# Patient Record
Sex: Male | Born: 1960 | Race: White | Hispanic: No | Marital: Married | Smoking: Never smoker
Health system: Southern US, Community
[De-identification: ages and names within clinical notes are randomized; demographics above are authoritative.]

## PROBLEM LIST (undated history)

## (undated) DIAGNOSIS — I679 Cerebrovascular disease, unspecified: Secondary | ICD-10-CM

## (undated) DIAGNOSIS — B019 Varicella without complication: Secondary | ICD-10-CM

## (undated) DIAGNOSIS — K219 Gastro-esophageal reflux disease without esophagitis: Secondary | ICD-10-CM

## (undated) DIAGNOSIS — T7840XA Allergy, unspecified, initial encounter: Secondary | ICD-10-CM

## (undated) DIAGNOSIS — C801 Malignant (primary) neoplasm, unspecified: Secondary | ICD-10-CM

## (undated) DIAGNOSIS — I671 Cerebral aneurysm, nonruptured: Secondary | ICD-10-CM

## (undated) DIAGNOSIS — I729 Aneurysm of unspecified site: Secondary | ICD-10-CM

## (undated) DIAGNOSIS — I10 Essential (primary) hypertension: Secondary | ICD-10-CM

## (undated) DIAGNOSIS — E785 Hyperlipidemia, unspecified: Secondary | ICD-10-CM

## (undated) DIAGNOSIS — I639 Cerebral infarction, unspecified: Secondary | ICD-10-CM

## (undated) DIAGNOSIS — R413 Other amnesia: Secondary | ICD-10-CM

## (undated) DIAGNOSIS — B029 Zoster without complications: Secondary | ICD-10-CM

## (undated) HISTORY — PX: TONSILLECTOMY: SUR1361

## (undated) HISTORY — PX: APPENDECTOMY: SHX54

## (undated) HISTORY — PX: CORONARY ANGIOPLASTY: SHX604

## (undated) HISTORY — PX: COLONOSCOPY: SHX174

## (undated) HISTORY — PX: VASECTOMY: SHX75

---

## 2007-06-05 ENCOUNTER — Emergency Department: Payer: Self-pay | Admitting: Emergency Medicine

## 2007-06-05 ENCOUNTER — Other Ambulatory Visit: Payer: Self-pay

## 2007-06-13 ENCOUNTER — Emergency Department: Payer: Self-pay | Admitting: Emergency Medicine

## 2007-06-22 ENCOUNTER — Other Ambulatory Visit: Payer: Self-pay

## 2007-06-22 ENCOUNTER — Emergency Department: Payer: Self-pay | Admitting: Emergency Medicine

## 2007-06-27 ENCOUNTER — Encounter: Admission: RE | Admit: 2007-06-27 | Discharge: 2007-09-25 | Payer: Self-pay | Admitting: Neurology

## 2008-01-05 ENCOUNTER — Ambulatory Visit: Payer: Self-pay | Admitting: Gastroenterology

## 2009-08-26 ENCOUNTER — Emergency Department: Payer: Self-pay | Admitting: Emergency Medicine

## 2011-10-28 ENCOUNTER — Ambulatory Visit: Payer: Self-pay | Admitting: Urology

## 2011-11-04 ENCOUNTER — Ambulatory Visit: Payer: Self-pay | Admitting: Urology

## 2012-02-26 ENCOUNTER — Ambulatory Visit: Payer: Self-pay | Admitting: Gastroenterology

## 2014-05-05 DIAGNOSIS — I6930 Unspecified sequelae of cerebral infarction: Secondary | ICD-10-CM | POA: Insufficient documentation

## 2014-11-18 NOTE — Op Note (Signed)
PATIENT NAME:  Alejandro Arias, Alejandro Arias MR#:  [id number] DATE OF BIRTH:  September 26, 1960  DATE OF PROCEDURE:  11/04/2011  PREOPERATIVE DIAGNOSIS: Undesired fertility.   POSTOPERATIVE DIAGNOSIS: Undesired fertility.   PROCEDURES:  1. Examination under anesthesia.  2. Bilateral partial vasectomy.   SURGEON: Jaydn Moscato C. Bernardo Heater, MD   ASSISTANT: None.   ANESTHETIC: General.   INDICATIONS: This is a 54 year old male with undesired fertility. He was initially evaluated in the office for vasectomy. At his counseling visit a left vas could not be palpated. The procedure was scheduled and on the date of procedure again a left vas could not be palpated. A right vasectomy was performed, however, on postprocedure semen analysis there were multiple sperm present. He desires to proceed with examination under anesthesia and possible left scrotal exploration.   DESCRIPTION OF PROCEDURE: The patient was taken to the operating room where a general anesthetic was administered. On examination under anesthesia, a left vas was palpable. His external genitalia were prepped and draped sterilely. Time-out was performed. A midline 1 cm scrotal incision was made. The left vas was grasped and brought to the incision. [place] was dissected. The vas was grasped with tenaculum. The vas sheath was incised and the vas was delivered. A 1 cm section was excised using a needlepoint Bovie the lumen was cannulated with a Bovie and cauterized both proximally and distally. The distal portion of the vas was buried in a separate fascial layer with a figure-of-eight 3-0 chromic suture.   Since he was under anesthesia, it was elected to repeat his right vasectomy. The vas granuloma was palpated through the midline incision. It was grasped with a tenaculum. The overlying tissue was dissected. The vas distal to the granuloma was dissected free. A 1 cm section was excised. The distal lumen was cannulated with the needlepoint Bovie and cauterized. Hemostasis  was adequate. The incision was closed with an interrupted 3-0 chromic suture. Before repeating the right vasectomy, it was discussed with the patient preoperatively to which he agreed. A dressing of a fluff scrotal support was applied. Incision was infiltrated with 1% plain Xylocaine prior to closure.   He was taken to PAC-U in stable condition. There were no complications. EBL was minimal.    ____________________________ Ronda Fairly. Bernardo Heater, MD scs:drc D: 11/04/2011 13:42:26 ET T: 11/04/2011 15:16:28 ET JOB#: [id number]  cc: Nicki Reaper C. Bernardo Heater, MD, <Dictator> Abbie Sons MD ELECTRONICALLY SIGNED 11/19/2011 18:46

## 2015-04-14 ENCOUNTER — Emergency Department
Admission: EM | Admit: 2015-04-14 | Discharge: 2015-04-14 | Disposition: A | Discharge status: Home / Self Care | Payer: Federal, State, Local not specified - PPO | Attending: Emergency Medicine | Admitting: Emergency Medicine

## 2015-04-14 ENCOUNTER — Emergency Department: Payer: Federal, State, Local not specified - PPO

## 2015-04-14 ENCOUNTER — Encounter: Payer: Self-pay | Admitting: Emergency Medicine

## 2015-04-14 DIAGNOSIS — W1839XA Other fall on same level, initial encounter: Secondary | ICD-10-CM | POA: Diagnosis not present

## 2015-04-14 DIAGNOSIS — S82191A Other fracture of upper end of right tibia, initial encounter for closed fracture: Secondary | ICD-10-CM | POA: Diagnosis not present

## 2015-04-14 DIAGNOSIS — Y9289 Other specified places as the place of occurrence of the external cause: Secondary | ICD-10-CM | POA: Diagnosis not present

## 2015-04-14 DIAGNOSIS — Y998 Other external cause status: Secondary | ICD-10-CM | POA: Insufficient documentation

## 2015-04-14 DIAGNOSIS — Z79899 Other long term (current) drug therapy: Secondary | ICD-10-CM | POA: Insufficient documentation

## 2015-04-14 DIAGNOSIS — S82401A Unspecified fracture of shaft of right fibula, initial encounter for closed fracture: Secondary | ICD-10-CM

## 2015-04-14 DIAGNOSIS — I10 Essential (primary) hypertension: Secondary | ICD-10-CM | POA: Diagnosis not present

## 2015-04-14 DIAGNOSIS — S82141A Displaced bicondylar fracture of right tibia, initial encounter for closed fracture: Secondary | ICD-10-CM

## 2015-04-14 DIAGNOSIS — Y9389 Activity, other specified: Secondary | ICD-10-CM | POA: Insufficient documentation

## 2015-04-14 DIAGNOSIS — S82831A Other fracture of upper and lower end of right fibula, initial encounter for closed fracture: Secondary | ICD-10-CM | POA: Insufficient documentation

## 2015-04-14 DIAGNOSIS — S8991XA Unspecified injury of right lower leg, initial encounter: Secondary | ICD-10-CM | POA: Diagnosis present

## 2015-04-14 HISTORY — DX: Aneurysm of unspecified site: I72.9

## 2015-04-14 HISTORY — DX: Cerebral infarction, unspecified: I63.9

## 2015-04-14 HISTORY — DX: Essential (primary) hypertension: I10

## 2015-04-14 MED ORDER — OXYCODONE-ACETAMINOPHEN 5-325 MG PO TABS: 1.0000 | ORAL_TABLET | Freq: Four times a day (QID) | ORAL | Status: DC | PRN

## 2015-04-14 MED ORDER — KETOROLAC TROMETHAMINE 60 MG/2ML IM SOLN
60.0000 mg | Freq: Once | INTRAMUSCULAR | Status: AC
  Administered 2015-04-14: 60 mg via INTRAMUSCULAR

## 2015-04-14 MED ORDER — KETOROLAC TROMETHAMINE 60 MG/2ML IM SOLN
INTRAMUSCULAR | Status: AC
  Administered 2015-04-14: 60 mg via INTRAMUSCULAR
  Filled 2015-04-14: qty 2

## 2015-04-14 MED ORDER — HYDROMORPHONE HCL 1 MG/ML IJ SOLN
INTRAMUSCULAR | Status: AC
  Administered 2015-04-14: 1 mg via INTRAMUSCULAR
  Filled 2015-04-14: qty 1

## 2015-04-14 MED ORDER — HYDROMORPHONE HCL 1 MG/ML IJ SOLN
1.0000 mg | Freq: Once | INTRAMUSCULAR | Status: AC
  Administered 2015-04-14: 1 mg via INTRAMUSCULAR

## 2015-04-14 NOTE — ED Notes (Signed)
Ice to right knee.

## 2015-04-14 NOTE — ED Notes (Signed)
AaoX3.  SKIN WARM AND DRY.  NAD

## 2015-04-14 NOTE — ED Provider Notes (Signed)
[facility] Emergency Department Provider Note ____________________________________________  Time seen: 2203   I have reviewed the triage vital signs and the nursing notes.  HISTORY  Chief Complaint  Knee Pain  HPI Alejandro Arias is a 54 y.o. male reports to the ED for acute right knee pain and disability after he accidentally fell off of his deck, about 3 feet. He landed on his feet, but his knee immediately buckled. He rates his pain at a 6/10 in triage.   Past Medical History  Diagnosis Date  . Stroke   . Hypertension   . Aneurysm     Brain   There are no active problems to display for this patient.  Past Surgical History  Procedure Laterality Date  . Appendectomy      Current Outpatient Rx  Name  Route  Sig  Dispense  Refill  . losartan-hydrochlorothiazide (HYZAAR) 100-12.5 MG per tablet   Oral   Take 1 tablet by mouth daily.         . pravastatin (PRAVACHOL) 40 MG tablet   Oral   Take 40 mg by mouth daily.         . vitamin B-12 (CYANOCOBALAMIN) 500 MCG tablet   Oral   Take 500 mcg by mouth daily.         Marland Kitchen oxyCODONE-acetaminophen (ROXICET) 5-325 MG per tablet   Oral   Take 1 tablet by mouth every 6 (six) hours as needed for moderate pain or severe pain.   20 tablet   0    Allergies Morphine and related  No family history on file.  Social History Social History  Substance Use Topics  . Smoking status: Never Smoker   . Smokeless tobacco: Never Used  . Alcohol Use: Yes   Review of Systems  Constitutional: Negative for fever. Eyes: Negative for visual changes. ENT: Negative for sore throat. Cardiovascular: Negative for chest pain. Respiratory: Negative for shortness of breath. Gastrointestinal: Negative for abdominal pain, vomiting and diarrhea. Genitourinary: Negative for dysuria. Musculoskeletal: Negative for back pain. Right knee pain Skin: Negative for rash. Neurological: Negative for headaches, focal weakness  or numbness. ____________________________________________  PHYSICAL EXAM:  VITAL SIGNS: ED Triage Vitals  Enc Vitals Group     BP 04/14/15 2133 126/91 mmHg     Pulse Rate 04/14/15 2133 86     Resp 04/14/15 2133 20     Temp --      Temp Source 04/14/15 2133 Oral     SpO2 04/14/15 2133 97 %     Weight 04/14/15 2133 195 lb (88.451 kg)     Height 04/14/15 2133 5\' 11"  (1.803 m)     Head Cir --      Peak Flow --      Pain Score 04/14/15 2130 5     Pain Loc --      Pain Edu? --      Excl. in [place]? --    Constitutional: Alert and oriented. Well appearing and in no distress. Eyes: Conjunctivae are normal. PERRL. Normal extraocular movements. ENT   Head: Normocephalic and atraumatic.   Nose: No congestion/rhinorrhea.   Mouth/Throat: Mucous membranes are moist.   Neck: Supple. No thyromegaly. Hematological/Lymphatic/Immunological: No cervical lymphadenopathy. Cardiovascular: Normal rate, regular rhythm.  Respiratory: Normal respiratory effort. No wheezes/rales/rhonchi. Gastrointestinal: Soft and nontender. No distention. Musculoskeletal: Right knee with lateral depression of fibular head prominence. No effusion noted. Nontender with normal range of motion in all extremities. No calf tenderness.  Neurologic:  Normal gait without ataxia. Normal speech and language. No gross focal neurologic deficits are appreciated. Skin:  Skin is warm, dry and intact. No rash noted. Psychiatric: Mood and affect are normal. Patient exhibits appropriate insight and judgment. ____________________________________________   RADIOLOGY  Right Knee IMPRESSION: Displaced oblique fracture of RIGHT lateral tibial plateau. RIGHT fibular head/neck fracture.  I, Menshew, Dannielle Karvonen, personally viewed and evaluated these images (plain radiographs) as part of my medical decision making.  ____________________________________________  PROCEDURES  Jones Compression Wrap/Knee  Immobilizer Crutches Dilaudid 1 mg IM Toradol 60 mg IM ____________________________________________  INITIAL IMPRESSION / ASSESSMENT AND PLAN / ED COURSE  ----------------------------------------- 11:00 PM on 04/14/2015 ----------------------------------------- Discussed images and management of depressed tibial plateau fracture with Dr. Jerline Pain. He will present to [employer]. Pain management and non-weight bearing until seen by ortho.    Initial fracture care provided for right tibial plateau fracture and knee sprain. Follow-up with Dr. Marry Guan this week. for further fracture care & management.  ____________________________________________  FINAL CLINICAL IMPRESSION(S) / ED DIAGNOSES  Final diagnoses:  Tibial plateau fracture, right, closed, initial encounter  Closed fibular fracture, right, initial encounter     Melvenia Needles, PA-C 04/14/15 2338  Earleen Newport, MD 04/17/15 [phone]

## 2015-04-14 NOTE — ED Notes (Signed)
Stepped off deck and now with right knee pain.

## 2015-04-14 NOTE — Discharge Instructions (Signed)
Cast or Splint Care Casts and splints support injured limbs and keep bones from moving while they heal.  HOME CARE  Keep the cast or splint uncovered during the drying period.  A plaster cast can take 24 to 48 hours to dry.  A fiberglass cast will dry in less than 1 hour.  Do not rest the cast on anything harder than a pillow for 24 hours.  Do not put weight on your injured limb. Do not put pressure on the cast. Wait for your doctor's approval.  Keep the cast or splint dry.  Cover the cast or splint with a plastic bag during baths or wet weather.  If you have a cast over your chest and belly (trunk), take sponge baths until the cast is taken off.  If your cast gets wet, dry it with a towel or blow dryer. Use the cool setting on the blow dryer.  Keep your cast or splint clean. Wash a dirty cast with a damp cloth.  Do not put any objects under your cast or splint.  Do not scratch the skin under the cast with an object. If itching is a problem, use a blow dryer on a cool setting over the itchy area.  Do not trim or cut your cast.  Do not take out the padding from inside your cast.  Exercise your joints near the cast as told by your doctor.  Raise (elevate) your injured limb on 1 or 2 pillows for the first 1 to 3 days. GET HELP IF:  Your cast or splint cracks.  Your cast or splint is too tight or too loose.  You itch badly under the cast.  Your cast gets wet or has a soft spot.  You have a bad smell coming from the cast.  You get an object stuck under the cast.  Your skin around the cast becomes red or sore.  You have new or more pain after the cast is put on. GET HELP RIGHT AWAY IF:  You have fluid leaking through the cast.  You cannot move your fingers or toes.  Your fingers or toes turn blue or white or are cool, painful, or puffy (swollen).  You have tingling or lose feeling (numbness) around the injured area.  You have bad pain or pressure under the  cast.  You have trouble breathing or have shortness of breath.  You have chest pain. Document Released: 11/12/2010 Document Revised: 03/15/2013 Document Reviewed: 01/19/2013 [facility] Patient Information 2015 [place], [place]. This information is not intended to replace advice given to you by your health care provider. Make sure you discuss any questions you have with your health care provider.  Tibial Plateau Fracture, Displaced, Adult You have a fracture (break in bone) of your tibial plateau. This is a fracture in the upper part of the large "shin" bone (tibia) in your lower leg. The plateau is the joint surface that buts up against the femur (thigh bone of your upper leg). This is what makes up your knee joint. Displaced means that a portion of the fracture is out of place from where the bone is supposed to be. Because this fracture goes into the knee joint, it is necessary that this fracture be fixed in the best position possible. This fracture must be fixed surgically. This means an operation must be done to get the bones into the best possible position for healing. Otherwise, this fracture can cause severe arthritis and marked disability over the years. This is likely  to occur even with the best treatment. These fractures are generally diagnosed with x-rays. Often a CT scan is needed to identify the fracture fragments and the degree of displacement. TREATMENT  Your fracture will be reduced (bones fragments are put back into position) and held in place with hardware (fixation devices). When your caregiver feels the fracture is healed well enough, you may begin range of motion exercises to keep your knee limber (moving well). This may be very difficult at first. It is necessary to follow the instructions of all your caregivers, your surgeon, and physical therapist following surgery. LET YOUR CAREGIVER KNOW ABOUT:  Allergies  Medications taken including herbs, eye drops, over the counter  medications, and creams  Use of steroids (by mouth or creams)  History of bleeding or blood problems  Previous problems with anesthetics or novocaine  Possibility of pregnancy, if this applies  History of blood clots (thrombophlebitis)  Previous surgery  Other health problems RISKS AND COMPLICATIONS All surgery is associated with risks. Some of these risks are:  Excessive bleeding.  Infection.  Failure to heal properly resulting in an unstable knee.  Stiffness of knee following repair.  Need to remove the hardware. BEFORE AND AFTER YOUR PROCEDURE Prior to surgery, an IV (intravenous line connected to your vein for giving fluids) may be started. You will also be given an anesthetic. These are medicines and gas to make you sleep. You may also be given regional anesthesia such as a spinal or epidural anesthetic. After surgery, you will be taken to the recovery area where a nurse will monitor your progress. You may have a catheter (a long, narrow, hollow tube) in your bladder following surgery that helps you pass your water. When you are awake, are stable, taking fluids well, and without complications, you will be returned to your room. You will receive physical therapy and other care until you are doing well and your caregiver feels it is safe for you to be transferred either to home or to an extended care facility. HOME CARE INSTRUCTIONS   You may resume normal diet and activities as directed or allowed.  Keep ice packs (a bag of ice wrapped in a towel) on the surgical area for twenty minutes, four times per day, for the first two days following surgery. Use ice only if OK with your surgeon or caregiver.  Change dressings if necessary or as directed.  If you have a plaster or fiberglass cast:  Do not try to scratch the skin under the cast using sharp or pointed objects.  Check the skin around the cast every day. You may put lotion on any red or sore areas.  Keep your cast dry  and clean.  Do not put pressure on any part of your cast or splint until it is fully hardened.  Your cast or splint can be protected during bathing with a plastic bag. Do not lower the cast or splint into water.  Only take over-the-counter or prescription medicines for pain, discomfort, or fever as directed by your caregiver.  Use crutches as directed and do not exercise leg unless instructed.  Keep toes and ankles moving frequently if they are not immobilized in your splint or cast  Keep your leg elevated above your heart as much as possible during the first 24-48 hours after your surgery  These are not fractures to be taken lightly! If these bones become displaced and get out of position, it may eventually lead to arthritis and disability for the  rest of your life. Problems often follow even the best of care. Follow the directions of your caregiver.  Keep appointments as directed. SEEK IMMEDIATE MEDICAL CARE IF:   There is redness, swelling, or increasing pain in the wound.  There is pus coming from wound.  An unexplained oral temperature above 102 F (38.9 C) develops.  You develop a bad smell coming from the wound or dressing.  There is a breaking open of the wound (edges not staying together) after sutures or staples have been removed.  You develop severe pain in the injured leg.  You begin to lose feeling in your foot or toes. Especially if someone else moving your toes becomes increasingly painful  You develop a cold or blue foot or toes on the injured side. If you do not have a window in your cast for observing the wound, a discharge or minor bleeding may show up as a stain on the outside of your cast or caster splint. Report these findings to your caregiver. Document Released: 04/04/2002 Document Revised: 10/05/2011 Document Reviewed: 10/04/2007 [facility] Patient Information 2015 [place], [place]. This information is not intended to replace advice given to you by your  health care provider. Make sure you discuss any questions you have with your health care provider.  Tibial and Fibular Fracture, Adult Tibial and fibular fracture is a break in the bones of your lower leg (tibia and fibula). The tibia is the larger of these two bones. The fibula is the smaller of the two bones. It is on the outer side of your leg.  CAUSES  Low-energy injuries, such as a fall from ground level.  High-energy injuries, such as motor vehicle injuries, gunshot wounds, or high-speed sports collisions. RISK FACTORS  Jumping activities.  Repetitive stress, such as long-distance running.  Participation in sports.  Osteoporosis.  Advanced age. SIGNS AND SYMPTOMS  Pain.  Swelling.  Inability to put weight on your injured leg.  Bone deformities at the site of your injury.  Bruising. DIAGNOSIS  Tibial and fibular fractures are diagnosed with the use of X-ray exams. TREATMENT  If you have a simple fracture of these two bones, they can be treated with simple immobilization. A cast or splint will be used on your leg to keep it from moving while it heals. Then you can begin range-of-motion exercises to regain your knee motion. HOME CARE INSTRUCTIONS   Apply ice to your leg:  Put ice in a plastic bag.  Place a towel between your skin and the bag.  Leave the ice on for 20 minutes, 2-3 times a day.  If you have a plaster or fiberglass cast:  Do not try to scratch the skin under the cast using sharp or pointed objects.  Check the skin around the cast every day. You may put lotion on any red or sore areas.  Keep your cast dry and clean.  If you have a plaster splint:  Wear the splint as directed.  You may loosen the elastic around the splint if your toes become numb, tingle, or turn cold or blue.  Do not put pressure on any part of your cast or splint until it is fully hardened, because it may deform.  Your cast or splint can be protected during bathing with a  plastic bag. Do not lower the cast or splint into water.  Use crutches as directed.  Only take over-the-counter or prescription medicines for pain, discomfort, or fever as directed by your health care provider.  Follow  all instructions given to you by your health care provider.  Make and keep all follow-up appointments. SEEK MEDICAL CARE IF:  Your pain is becoming worse rather than better or is not controlled with medicines.  You have increased swelling or redness in the foot.  You begin to lose feeling in your foot or toes. SEEK IMMEDIATE MEDICAL CARE IF:  You develop a cold or blue foot or toes on the injured side.  You develop severe pain in your injured leg, especially if the pain is increased with movement of your toes. MAKE SURE YOU:  Understand these instructions.  Will watch your condition.  Will get help right away if you are not doing well or get worse. Document Released: 04/04/2002 Document Revised: 05/03/2013 Document Reviewed: 02/22/2013 [facility] Patient Information 2015 [place], [place]. This information is not intended to replace advice given to you by your health care provider. Make sure you discuss any questions you have with your health care provider.  Wear the splint & immobilizer AT ALL TIMES! You may use the crutches for walking Without ANY Weight on your right leg. Take the pain medicine as needed. Apply ice through the splint for pain and swelling. Expect a call from Dr. Clydell Hakim office to schedule follow-up for the next week.

## 2015-08-07 ENCOUNTER — Emergency Department
Admission: EM | Admit: 2015-08-07 | Discharge: 2015-08-08 | Disposition: A | Discharge status: Home / Self Care | Payer: Federal, State, Local not specified - PPO | Attending: Emergency Medicine | Admitting: Emergency Medicine

## 2015-08-07 ENCOUNTER — Encounter: Payer: Self-pay | Admitting: Urgent Care

## 2015-08-07 DIAGNOSIS — M542 Cervicalgia: Secondary | ICD-10-CM | POA: Diagnosis not present

## 2015-08-07 DIAGNOSIS — I10 Essential (primary) hypertension: Secondary | ICD-10-CM | POA: Diagnosis not present

## 2015-08-07 DIAGNOSIS — R51 Headache: Secondary | ICD-10-CM | POA: Diagnosis not present

## 2015-08-07 DIAGNOSIS — R519 Headache, unspecified: Secondary | ICD-10-CM

## 2015-08-07 DIAGNOSIS — Z79899 Other long term (current) drug therapy: Secondary | ICD-10-CM | POA: Insufficient documentation

## 2015-08-07 HISTORY — DX: Hyperlipidemia, unspecified: E78.5

## 2015-08-07 NOTE — ED Notes (Signed)
Pt states he has neck pain that began about an hour ago, states it is mostly in front, on both sides of neck, not radiating anywhere. Pt is able to turn head from side to side. Pt also states he has HTN, has hx of HTN, wife took BP at home. Pt took baby aspirin and 1 Amlodipine before arrival.

## 2015-08-07 NOTE — ED Notes (Signed)
Patient presents with c/o pain in his neck. Patient reports that he was sitting at home and began feeling "really hot" and BP was elevated to the 170s/110s. PMH significant for CVA x 6, HTN, and cerebral aneurysm

## 2015-08-08 ENCOUNTER — Emergency Department: Payer: Federal, State, Local not specified - PPO

## 2015-08-08 DIAGNOSIS — M542 Cervicalgia: Secondary | ICD-10-CM | POA: Diagnosis not present

## 2015-08-08 LAB — COMPREHENSIVE METABOLIC PANEL
ALT: 18 U/L (ref 17–63)
AST: 15 U/L (ref 15–41)
Albumin: 4.1 g/dL (ref 3.5–5.0)
Alkaline Phosphatase: 71 U/L (ref 38–126)
Anion gap: 7 (ref 5–15)
BUN: 20 mg/dL (ref 6–20)
CHLORIDE: 105 mmol/L (ref 101–111)
CO2: 26 mmol/L (ref 22–32)
Calcium: 8.9 mg/dL (ref 8.9–10.3)
Creatinine, Ser: 1.19 mg/dL (ref 0.61–1.24)
GFR calc Af Amer: >60 mL/min (ref >60)
Glucose, Bld: 125 mg/dL — ABNORMAL HIGH (ref 65–99)
POTASSIUM: 3.3 mmol/L — AB (ref 3.5–5.1)
SODIUM: 138 mmol/L (ref 135–145)
Total Bilirubin: 0.8 mg/dL (ref 0.3–1.2)
Total Protein: 7.3 g/dL (ref 6.5–8.1)

## 2015-08-08 LAB — CBC
HCT: 45.8 % (ref 40.0–52.0)
Hemoglobin: 15.4 g/dL (ref 13.0–18.0)
MCH: 27.5 pg (ref 26.0–34.0)
MCHC: 33.6 g/dL (ref 32.0–36.0)
MCV: 81.8 fL (ref 80.0–100.0)
PLATELETS: 272 10*3/uL (ref 150–440)
RBC: 5.6 MIL/uL (ref 4.40–5.90)
RDW: 13.8 % (ref 11.5–14.5)
WBC: 8.4 10*3/uL (ref 3.8–10.6)

## 2015-08-08 LAB — TROPONIN I: Troponin I: <0.03 ng/mL (ref <0.031)

## 2015-08-08 LAB — PROTIME-INR
INR: 0.97
PROTHROMBIN TIME: 13.1 s (ref 11.4–15.0)

## 2015-08-08 MED ORDER — GADOBENATE DIMEGLUMINE 529 MG/ML IV SOLN
20.0000 mL | Freq: Once | INTRAVENOUS | Status: AC | PRN
  Administered 2015-08-08: 18 mL via INTRAVENOUS

## 2015-08-08 NOTE — ED Notes (Signed)
Pt states he had 6 strokes in 2 weeks, states he was left with peripheral vision loss, sees sepia tone, short term memory loss. Pt states 2 strokes were hemorrhagic. Pt has small brain aneurism from [place]. This was all 8 years ago.

## 2015-08-08 NOTE — ED Notes (Signed)
MRI tech, Jinny Blossom, called by Dr. Owens Shark. Jinny Blossom came and MRI check list completed. [place], [place] or [place], ED tech will come sit with MRI tech and pt while scan is being done.

## 2015-08-08 NOTE — ED Provider Notes (Addendum)
[facility] Emergency Department Provider Note  ____________________________________________  Time seen: 11:55 PM  I have reviewed the triage vital signs and the nursing notes.   HISTORY  Chief Complaint Neck Pain and Hypertension      HPI Alejandro Arias is a 55 y.o. male presents with acute onset of bilateral anterior neck pain and elevated blood pressure 170/110 this evening. 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 Patient denies any weakness no numbness or gait instability. Patient denies any new change in vision.     Past Medical History  Diagnosis Date  . Stroke ([place])   . Hypertension   . Aneurysm ([place])     Brain  . Hyperlipemia     There are no active problems to display for this patient.   Past Surgical History  Procedure Laterality Date  . Appendectomy      Current Outpatient Rx  Name  Route  Sig  Dispense  Refill  . losartan-hydrochlorothiazide (HYZAAR) 100-12.5 MG per tablet   Oral   Take 1 tablet by mouth daily.         Marland Kitchen oxyCODONE-acetaminophen (ROXICET) 5-325 MG per tablet   Oral   Take 1 tablet by mouth every 6 (six) hours as needed for moderate pain or severe pain.   20 tablet   0   . pravastatin (PRAVACHOL) 40 MG tablet   Oral   Take 40 mg by mouth daily.         . vitamin B-12 (CYANOCOBALAMIN) 500 MCG tablet   Oral   Take 500 mcg by mouth daily.           Allergies Morphine and related  No family history on file.  Social History Social History  Substance Use Topics  . Smoking status: Never Smoker   . Smokeless tobacco: Never Used  . Alcohol Use: Yes    Review of Systems  Constitutional: Negative for fever. Eyes: Negative for visual changes. ENT: Negative for sore throat.Positive for neck pain   Cardiovascular: Negative for chest pain. Respiratory: Negative for shortness of breath. Gastrointestinal: Negative for abdominal pain, vomiting and diarrhea. Genitourinary: Negative for dysuria. Musculoskeletal: Negative for back pain. Skin: Negative for rash. Neurological: Negative for headaches, focal weakness or numbness.   10-point ROS otherwise negative.  ____________________________________________   PHYSICAL EXAM:  VITAL SIGNS: ED Triage Vitals  Enc Vitals Group     BP 08/07/15 2340 156/101 mmHg     Pulse Rate 08/07/15 2340 78     Resp 08/07/15 2340 18     Temp --      Temp src --      SpO2 08/07/15 2340 98 %     Weight 08/07/15 2340 195 lb (88.451 kg)     Height 08/07/15 2340 5\' 11"  (1.803 m)     Head Cir --      Peak Flow --      Pain Score 08/07/15 2341 0     Pain Loc --      Pain Edu? --      Excl. in [place]? --      Constitutional: Alert and oriented. Well appearing and in no distress. Eyes: Conjunctivae are normal. PERRL. Normal extraocular movements. ENT   Head: Normocephalic and atraumatic.   Nose: No  congestion/rhinnorhea.   Mouth/Throat: Mucous membranes are moist.   Neck: No stridor. Hematological/Lymphatic/Immunilogical: No cervical lymphadenopathy. Cardiovascular: Normal rate, regular rhythm. Normal and symmetric distal pulses are present in all extremities. No murmurs, rubs, or gallops. Respiratory: Normal respiratory effort without tachypnea nor retractions. Breath sounds are clear and equal bilaterally. No wheezes/rales/rhonchi. Gastrointestinal: Soft and nontender. No distention. There is no CVA tenderness. Genitourinary: deferred Musculoskeletal: Nontender with normal range of motion in all extremities. No joint effusions.  No lower extremity tenderness nor edema. Neurologic:  Normal speech and language. No gross focal neurologic deficits are appreciated. Speech is normal.  Skin:  Skin is warm, dry and intact. No rash  noted. Psychiatric: Mood and affect are normal. Speech and behavior are normal. Patient exhibits appropriate insight and judgment.  ____________________________________________    LABS (pertinent positives/negatives)  Labs Reviewed  COMPREHENSIVE METABOLIC PANEL - Abnormal; Notable for the following:    Potassium 3.3 (*)    Glucose, Bld 125 (*)    All other components within normal limits  CBC  PROTIME-INR  TROPONIN I   ED ECG REPORT I, Joshaua Epple, Cherry N, the attending physician, personally viewed and interpreted this ECG.   Date: 08/08/2015  EKG Time: 11:42 PM  Rate: 76  Rhythm: Normal sinus rhythm  Axis: None  Intervals: Normal  ST&T Change: None    RADIOLOGY      MR Brain W Wo Contrast (Final result) Result time: 08/08/15 05:05:04   Final result by Rad Results In Interface (08/08/15 05:05:04)   Narrative:   CLINICAL DATA: Initial evaluation for acute headache, neck pain.  EXAM: MRI HEAD WITHOUT AND WITH CONTRAST  MRA HEAD WITHOUT CONTRAST  MRA NECK WITHOUT AND WITH CONTRAST  TECHNIQUE: Multiplanar, multiecho pulse sequences of the brain and surrounding structures were obtained without and with intravenous contrast. Angiographic images of the Circle of Willis were obtained using MRA technique without intravenous contrast. Angiographic images of the neck were obtained using MRA technique without and with intravenous contrast. Carotid stenosis measurements (when applicable) are obtained utilizing NASCET criteria, using the distal internal carotid diameter as the denominator.  CONTRAST: 20mL MULTIHANCE GADOBENATE DIMEGLUMINE 529 MG/ML IV SOLN  COMPARISON: Prior CT from 08/26/2009.  FINDINGS: MRI HEAD FINDINGS  Cerebral volume within normal limits for patient age. No significant white matter disease. Extensive encephalomalacia involving the bilateral occipital lobes with associated gliosis, consistent with remote ischemic infarcts. Associated  chronic blood products present with these infarcts.  Heterogeneous T2 popcorn like lesion within the periventricular white matter adjacent to the posterior left lateral ventricle measures 17 mm. Imaging characteristics most consistent with a cavernoma. This lesion demonstrates intrinsic T1 signal intensity with extensive blooming artifact on gradient echo sequence, compatible with blood products. No significant edema about this lesion to suggest acute hemorrhage. Moreover, this lesion is similar in size relative to prior CT from 2011. Small 4 mm cavernoma within the deep white matter of the anterior right centrum semi ovale. 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  No evidence for acute infarct. Major intracranial vascular flow voids are maintained. Remaining gray-white matter differentiation well preserved.  No other mass lesion. No mass effect or midline shift. No hydrocephalus. Ex vacuo dilatation of the occipital horns of both lateral ventricles present related to the remote occipital infarcts. No extra-axial fluid collection. No abnormal enhancement.  Craniocervical junction normal.  Pituitary gland normal.  No acute abnormality about the orbits.  Right maxillary sinus is completely opacified. Scattered polypoid opacity within the left maxillary sinus. Scattered opacity with mucosal thickening within the ethmoidal air cells, sphenoid sinuses, and left frontal sinus. Trace opacity within the left mastoid air cells. Inner ear structures grossly normal.  Bone marrow signal intensity within normal limits. No scalp soft tissue abnormality.  MRA HEAD FINDINGS  ANTERIOR CIRCULATION:  Study degraded by motion artifact.  Visualized distal cervical segments of the internal  carotid arteries are widely patent with antegrade flow. Petrous, cavernous, and supraclinoid segments are well opacified. A1 segments patent. The left A1 segment is slightly hypoplastic. Anterior communicating artery not well evaluated on this exam due to motion artifact. Despite this, there is question of a small 4-5 mm aneurysm arising from the anterior communicating artery, better evaluated on MRA neck portion of this exam (series 9, image 86). Anterior cerebral arteries well opacified. M1 segments patent without stenosis or occlusion. Attenuation of the distal M1 segments consistent with artifact/motion. MCA bifurcations within normal limits. Distal MCA branches opacified and symmetric.  POSTERIOR CIRCULATION:  Vertebral arteries patent to the vertebrobasilar junction. Right vertebral artery is dominant. Distal vertebral arteries mildly with mild multi focal narrowing without focal high-grade stenosis. Posterior inferior cerebral arteries not well evaluated on this exam. Basilar artery widely patent. Superior cerebellar and posterior cerebral arteries opacified bilaterally.  No convincing evidence for aneurysm on this exam.  MRA NECK FINDINGS  Partially visualized aortic arch is of normal caliber with normal 3 vessel morphology. No high-grade stenosis seen at the origin of the great vessels. Partially visualized subclavian arteries patent without acute abnormality.  Right common carotid artery patent from its origin to the bifurcation. No significant atheromatous disease about the right carotid bifurcation. Right ICA patent from the bifurcation to the skull base without flow-limiting stenosis or occlusion. No definite evidence for dissection, although evaluation somewhat limited on this exam.  Left common carotid artery patent from its origin to the bifurcation. Left ICA widely patent from the bifurcation to the skullbase. No evidence for stenosis, occlusion, or definite  evidence for dissection, although evaluation limited on this exam.  Vertebral arteries both arise from the subclavian arteries. Right vertebral artery dominant. Vertebral arteries widely patent to the skullbase without focal stenosis, occlusion, or definite evidence for dissection.  IMPRESSION: MRI HEAD IMPRESSION:  1. No acute intracranial process. 2. Remote hemorrhagic bilateral occipital infarcts with associated gliosis and encephalomalacia. 3. 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 Dominant lesion within the left periventricular white matter, similar to prior CT from 2011.  MRA HEAD IMPRESSION:  1. No proximal or large arterial branch occlusion. No high-grade or correctable stenosis. 2. Atheromatous irregularity with mild multi focal narrowing within the distal vertebral arteries bilaterally. 3. 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 This is better evaluated on the MRA neck portion of this exam.  MRA NECK IMPRESSION:  Normal MRA of the neck.   Electronically Signed By: Jeannine Boga M.D. On: 08/08/2015 05:04          MR Angiogram Neck W Wo Contrast (Final result) Result time: 08/08/15 05:05:04   Final result by Rad Results In Interface (08/08/15 05:05:04)   Narrative:   CLINICAL DATA: Initial evaluation for acute headache, neck pain.  EXAM: MRI HEAD WITHOUT AND WITH CONTRAST  MRA HEAD WITHOUT CONTRAST  MRA NECK WITHOUT AND WITH CONTRAST  TECHNIQUE: Multiplanar, multiecho pulse sequences of the brain and surrounding structures were obtained without  and with intravenous contrast. Angiographic images of the Circle of Willis were obtained using MRA technique without intravenous contrast. Angiographic images of the neck were obtained using MRA technique without and with intravenous contrast. Carotid stenosis measurements (when applicable) are obtained utilizing NASCET criteria, using  the distal internal carotid diameter as the denominator.  CONTRAST: 80mL MULTIHANCE GADOBENATE DIMEGLUMINE 529 MG/ML IV SOLN  COMPARISON: Prior CT from 08/26/2009.  FINDINGS: MRI HEAD FINDINGS  Cerebral volume within normal limits for patient age. No significant white matter disease. Extensive encephalomalacia involving the bilateral occipital lobes with associated gliosis, consistent with remote ischemic infarcts. Associated chronic blood products present with these infarcts.  Heterogeneous T2 popcorn like lesion within the periventricular white matter adjacent to the posterior left lateral ventricle measures 17 mm. Imaging characteristics most consistent with a cavernoma. This lesion demonstrates intrinsic T1 signal intensity with extensive blooming artifact on gradient echo sequence, compatible with blood products. No significant edema about this lesion to suggest acute hemorrhage. Moreover, this lesion is similar in size relative to prior CT from 2011. Small 4 mm cavernoma within the deep white matter of the anterior right centrum semi ovale. 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  No evidence for acute infarct. Major intracranial vascular flow voids are maintained. Remaining gray-white matter differentiation well preserved.  No other mass lesion. No mass effect or midline shift. No hydrocephalus. Ex vacuo dilatation of the occipital horns of both lateral ventricles present related to the remote occipital infarcts. No extra-axial fluid collection. No abnormal enhancement.  Craniocervical junction normal.  Pituitary gland normal. No acute abnormality about the orbits.  Right maxillary sinus is completely opacified. Scattered polypoid opacity within the left  maxillary sinus. Scattered opacity with mucosal thickening within the ethmoidal air cells, sphenoid sinuses, and left frontal sinus. Trace opacity within the left mastoid air cells. Inner ear structures grossly normal.  Bone marrow signal intensity within normal limits. No scalp soft tissue abnormality.  MRA HEAD FINDINGS  ANTERIOR CIRCULATION:  Study degraded by motion artifact.  Visualized distal cervical segments of the internal carotid arteries are widely patent with antegrade flow. Petrous, cavernous, and supraclinoid segments are well opacified. A1 segments patent. The left A1 segment is slightly hypoplastic. Anterior communicating artery not well evaluated on this exam due to motion artifact. Despite this, there is question of a small 4-5 mm aneurysm arising from the anterior communicating artery, better evaluated on MRA neck portion of this exam (series 9, image 86). Anterior cerebral arteries well opacified. M1 segments patent without stenosis or occlusion. Attenuation of the distal M1 segments consistent with artifact/motion. MCA bifurcations within normal limits. Distal MCA branches opacified and symmetric.  POSTERIOR CIRCULATION:  Vertebral arteries patent to the vertebrobasilar junction. Right vertebral artery is dominant. Distal vertebral arteries mildly with mild multi focal narrowing without focal high-grade stenosis. Posterior inferior cerebral arteries not well evaluated on this exam. Basilar artery widely patent. Superior cerebellar and posterior cerebral arteries opacified bilaterally.  No convincing evidence for aneurysm on this exam.  MRA NECK FINDINGS  Partially visualized aortic arch is of normal caliber with normal 3 vessel morphology. No high-grade stenosis seen at the origin of the great vessels. Partially visualized subclavian arteries patent without acute abnormality.  Right common carotid artery patent from its origin to the bifurcation. No  significant atheromatous disease about the right carotid bifurcation. Right ICA patent  from the bifurcation to the skull base without flow-limiting stenosis or occlusion. No definite evidence for dissection, although evaluation somewhat limited on this exam.  Left common carotid artery patent from its origin to the bifurcation. Left ICA widely patent from the bifurcation to the skullbase. No evidence for stenosis, occlusion, or definite evidence for dissection, although evaluation limited on this exam.  Vertebral arteries both arise from the subclavian arteries. Right vertebral artery dominant. Vertebral arteries widely patent to the skullbase without focal stenosis, occlusion, or definite evidence for dissection.  IMPRESSION: MRI HEAD IMPRESSION:  1. No acute intracranial process. 2. Remote hemorrhagic bilateral occipital infarcts with associated gliosis and encephalomalacia. 3. 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 Dominant lesion within the left periventricular white matter, similar to prior CT from 2011.  MRA HEAD IMPRESSION:  1. No proximal or large arterial branch occlusion. No high-grade or correctable stenosis. 2. Atheromatous irregularity with mild multi focal narrowing within the distal vertebral arteries bilaterally. 3. 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 This is better evaluated on the MRA neck portion of this exam.  MRA NECK IMPRESSION:  Normal MRA of the neck.   Electronically Signed By: Jeannine Boga M.D. On: 08/08/2015 05:04          MR MRA HEAD WO CONTRAST (Final result) Result time: 08/08/15 05:05:04   Final result by Rad Results In Interface (08/08/15 05:05:04)   Narrative:   CLINICAL DATA: Initial evaluation for acute headache, neck pain.  EXAM: MRI HEAD WITHOUT AND WITH CONTRAST  MRA HEAD WITHOUT CONTRAST  MRA NECK WITHOUT AND WITH  CONTRAST  TECHNIQUE: Multiplanar, multiecho pulse sequences of the brain and surrounding structures were obtained without and with intravenous contrast. Angiographic images of the Circle of Willis were obtained using MRA technique without intravenous contrast. Angiographic images of the neck were obtained using MRA technique without and with intravenous contrast. Carotid stenosis measurements (when applicable) are obtained utilizing NASCET criteria, using the distal internal carotid diameter as the denominator.  CONTRAST: 26mL MULTIHANCE GADOBENATE DIMEGLUMINE 529 MG/ML IV SOLN  COMPARISON: Prior CT from 08/26/2009.  FINDINGS: MRI HEAD FINDINGS  Cerebral volume within normal limits for patient age. No significant white matter disease. Extensive encephalomalacia involving the bilateral occipital lobes with associated gliosis, consistent with remote ischemic infarcts. Associated chronic blood products present with these infarcts.  Heterogeneous T2 popcorn like lesion within the periventricular white matter adjacent to the posterior left lateral ventricle measures 17 mm. Imaging characteristics most consistent with a cavernoma. This lesion demonstrates intrinsic T1 signal intensity with extensive blooming artifact on gradient echo sequence, compatible with blood products. No significant edema about this lesion to suggest acute hemorrhage. Moreover, this lesion is similar in size relative to prior CT from 2011. Small 4 mm cavernoma within the deep white matter of the anterior right centrum semi ovale. 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  No evidence for acute infarct. Major intracranial vascular flow voids are maintained. Remaining gray-white matter differentiation well  preserved.  No other mass lesion. No mass effect or midline shift. No hydrocephalus. Ex vacuo dilatation of the occipital horns of both lateral ventricles present related to the remote occipital infarcts. No extra-axial fluid collection. No abnormal enhancement.  Craniocervical junction normal.  Pituitary gland normal. No acute abnormality about the orbits.  Right maxillary sinus is completely opacified. Scattered polypoid opacity  within the left maxillary sinus. Scattered opacity with mucosal thickening within the ethmoidal air cells, sphenoid sinuses, and left frontal sinus. Trace opacity within the left mastoid air cells. Inner ear structures grossly normal.  Bone marrow signal intensity within normal limits. No scalp soft tissue abnormality.  MRA HEAD FINDINGS  ANTERIOR CIRCULATION:  Study degraded by motion artifact.  Visualized distal cervical segments of the internal carotid arteries are widely patent with antegrade flow. Petrous, cavernous, and supraclinoid segments are well opacified. A1 segments patent. The left A1 segment is slightly hypoplastic. Anterior communicating artery not well evaluated on this exam due to motion artifact. Despite this, there is question of a small 4-5 mm aneurysm arising from the anterior communicating artery, better evaluated on MRA neck portion of this exam (series 9, image 86). Anterior cerebral arteries well opacified. M1 segments patent without stenosis or occlusion. Attenuation of the distal M1 segments consistent with artifact/motion. MCA bifurcations within normal limits. Distal MCA branches opacified and symmetric.  POSTERIOR CIRCULATION:  Vertebral arteries patent to the vertebrobasilar junction. Right vertebral artery is dominant. Distal vertebral arteries mildly with mild multi focal narrowing without focal high-grade stenosis. Posterior inferior cerebral arteries not well evaluated on this exam. Basilar artery widely  patent. Superior cerebellar and posterior cerebral arteries opacified bilaterally.  No convincing evidence for aneurysm on this exam.  MRA NECK FINDINGS  Partially visualized aortic arch is of normal caliber with normal 3 vessel morphology. No high-grade stenosis seen at the origin of the great vessels. Partially visualized subclavian arteries patent without acute abnormality.  Right common carotid artery patent from its origin to the bifurcation. No significant atheromatous disease about the right carotid bifurcation. Right ICA patent from the bifurcation to the skull base without flow-limiting stenosis or occlusion. No definite evidence for dissection, although evaluation somewhat limited on this exam.  Left common carotid artery patent from its origin to the bifurcation. Left ICA widely patent from the bifurcation to the skullbase. No evidence for stenosis, occlusion, or definite evidence for dissection, although evaluation limited on this exam.  Vertebral arteries both arise from the subclavian arteries. Right vertebral artery dominant. Vertebral arteries widely patent to the skullbase without focal stenosis, occlusion, or definite evidence for dissection.  IMPRESSION: MRI HEAD IMPRESSION:  1. No acute intracranial process. 2. Remote hemorrhagic bilateral occipital infarcts with associated gliosis and encephalomalacia. 3. 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 Dominant lesion within the left periventricular white matter, similar to prior CT from 2011.  MRA HEAD IMPRESSION:  1. No proximal or large arterial branch occlusion. No high-grade or correctable stenosis. 2. Atheromatous irregularity with mild multi focal narrowing within the distal vertebral arteries bilaterally. 3. 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 This is better evaluated on the MRA neck portion of this exam.  MRA NECK IMPRESSION:  Normal  MRA of the neck.   Electronically Signed By: Jeannine Boga M.D      [place] / [place] / ED COURSE  Pertinent labs & imaging results that were available during my care of the patient were reviewed by me and considered in my medical decision making (see chart for details).  No clear etiology noted for the patient's anterior neck pain. MRA of the neck read as normal. No acute findings noted on the patient's MRA of the brain. Patient will be referred to Dr. Myrtie Hawk primary care provider for further evaluation on the outpatient setting. Of note patient has no pain at present  ____________________________________________  FINAL CLINICAL IMPRESSION(S) / ED DIAGNOSES  Final diagnoses:  Headache  Neck pain      Gregor Hams, MD 08/08/15 0530  Gregor Hams, MD 08/08/15 [phone]

## 2016-07-16 ENCOUNTER — Encounter
Admission: RE | Disposition: A | Discharge status: Home / Self Care | Payer: Self-pay | Source: Ambulatory Visit | Attending: Internal Medicine

## 2016-07-16 ENCOUNTER — Observation Stay
Admission: RE | Admit: 2016-07-16 | Discharge: 2016-07-17 | Disposition: A | Discharge status: Home / Self Care | Payer: Federal, State, Local not specified - PPO | Source: Ambulatory Visit | Attending: Internal Medicine | Admitting: Internal Medicine

## 2016-07-16 ENCOUNTER — Encounter: Payer: Self-pay | Admitting: *Deleted

## 2016-07-16 DIAGNOSIS — Z955 Presence of coronary angioplasty implant and graft: Secondary | ICD-10-CM | POA: Diagnosis not present

## 2016-07-16 DIAGNOSIS — J45909 Unspecified asthma, uncomplicated: Secondary | ICD-10-CM | POA: Insufficient documentation

## 2016-07-16 DIAGNOSIS — E785 Hyperlipidemia, unspecified: Secondary | ICD-10-CM | POA: Diagnosis not present

## 2016-07-16 DIAGNOSIS — Z888 Allergy status to other drugs, medicaments and biological substances status: Secondary | ICD-10-CM | POA: Diagnosis not present

## 2016-07-16 DIAGNOSIS — Z800 Family history of malignant neoplasm of digestive organs: Secondary | ICD-10-CM | POA: Diagnosis not present

## 2016-07-16 DIAGNOSIS — Z885 Allergy status to narcotic agent status: Secondary | ICD-10-CM | POA: Diagnosis not present

## 2016-07-16 DIAGNOSIS — Z8673 Personal history of transient ischemic attack (TIA), and cerebral infarction without residual deficits: Secondary | ICD-10-CM | POA: Insufficient documentation

## 2016-07-16 DIAGNOSIS — Z833 Family history of diabetes mellitus: Secondary | ICD-10-CM | POA: Diagnosis not present

## 2016-07-16 DIAGNOSIS — N183 Chronic kidney disease, stage 3 (moderate): Secondary | ICD-10-CM | POA: Diagnosis not present

## 2016-07-16 DIAGNOSIS — R9431 Abnormal electrocardiogram [ECG] [EKG]: Secondary | ICD-10-CM | POA: Insufficient documentation

## 2016-07-16 DIAGNOSIS — Z820 Family history of epilepsy and other diseases of the nervous system: Secondary | ICD-10-CM | POA: Diagnosis not present

## 2016-07-16 DIAGNOSIS — Z8249 Family history of ischemic heart disease and other diseases of the circulatory system: Secondary | ICD-10-CM | POA: Insufficient documentation

## 2016-07-16 DIAGNOSIS — I25110 Atherosclerotic heart disease of native coronary artery with unstable angina pectoris: Principal | ICD-10-CM | POA: Insufficient documentation

## 2016-07-16 DIAGNOSIS — Z79899 Other long term (current) drug therapy: Secondary | ICD-10-CM | POA: Insufficient documentation

## 2016-07-16 DIAGNOSIS — I129 Hypertensive chronic kidney disease with stage 1 through stage 4 chronic kidney disease, or unspecified chronic kidney disease: Secondary | ICD-10-CM | POA: Diagnosis not present

## 2016-07-16 DIAGNOSIS — Z95820 Peripheral vascular angioplasty status with implants and grafts: Secondary | ICD-10-CM

## 2016-07-16 HISTORY — PX: CARDIAC CATHETERIZATION: SHX172

## 2016-07-16 LAB — CARDIAC CATHETERIZATION: Cath EF Quantitative: 50 %

## 2016-07-16 SURGERY — LEFT HEART CATH AND CORONARY ANGIOGRAPHY
Anesthesia: Moderate Sedation

## 2016-07-16 MED ORDER — ONDANSETRON HCL 4 MG/2ML IJ SOLN: 4.0000 mg | Freq: Four times a day (QID) | INTRAMUSCULAR | Status: DC | PRN

## 2016-07-16 MED ORDER — ISOSORBIDE MONONITRATE ER 30 MG PO TB24
30.0000 mg | ORAL_TABLET | Freq: Every day | ORAL | Status: DC
  Administered 2016-07-17: 30 mg via ORAL
  Filled 2016-07-16: qty 1

## 2016-07-16 MED ORDER — MIDAZOLAM HCL 2 MG/2ML IJ SOLN
INTRAMUSCULAR | Status: AC
  Filled 2016-07-16: qty 2

## 2016-07-16 MED ORDER — ROSUVASTATIN CALCIUM 20 MG PO TABS
40.0000 mg | ORAL_TABLET | Freq: Every day | ORAL | Status: DC
  Administered 2016-07-16 – 2016-07-17 (×2): 40 mg via ORAL
  Filled 2016-07-16: qty 1
  Filled 2016-07-16 (×2): qty 2

## 2016-07-16 MED ORDER — NITROGLYCERIN 5 MG/ML IV SOLN
INTRAVENOUS | Status: AC
  Filled 2016-07-16: qty 10

## 2016-07-16 MED ORDER — CLOPIDOGREL BISULFATE 75 MG PO TABS
75.0000 mg | ORAL_TABLET | Freq: Every day | ORAL | Status: DC
  Administered 2016-07-17: 75 mg via ORAL
  Filled 2016-07-16: qty 1

## 2016-07-16 MED ORDER — SODIUM CHLORIDE 0.9% FLUSH
10.0000 mL | Freq: Once | INTRAVENOUS | Status: AC
  Administered 2016-07-16: 10 mL via INTRAVENOUS

## 2016-07-16 MED ORDER — BIVALIRUDIN BOLUS VIA INFUSION - CUPID
INTRAVENOUS | Status: DC | PRN
  Administered 2016-07-16: 66.375 mg via INTRAVENOUS

## 2016-07-16 MED ORDER — SODIUM CHLORIDE 0.9% FLUSH: 3.0000 mL | INTRAVENOUS | Status: DC | PRN

## 2016-07-16 MED ORDER — SODIUM CHLORIDE 0.9 % IV SOLN
0.2500 mg/kg/h | INTRAVENOUS | Status: AC
  Filled 2016-07-16: qty 250

## 2016-07-16 MED ORDER — TICAGRELOR 90 MG PO TABS
ORAL_TABLET | ORAL | Status: AC
  Filled 2016-07-16: qty 2

## 2016-07-16 MED ORDER — MIDAZOLAM HCL 2 MG/2ML IJ SOLN
INTRAMUSCULAR | Status: DC | PRN
  Administered 2016-07-16: 1 mg via INTRAVENOUS

## 2016-07-16 MED ORDER — BIVALIRUDIN 250 MG IV SOLR
INTRAVENOUS | Status: AC
  Filled 2016-07-16: qty 250

## 2016-07-16 MED ORDER — CLOPIDOGREL BISULFATE 75 MG PO TABS
300.0000 mg | ORAL_TABLET | Freq: Once | ORAL | Status: DC
Start: 2016-07-16 — End: 2016-07-17

## 2016-07-16 MED ORDER — LABETALOL HCL 5 MG/ML IV SOLN: 10.0000 mg | INTRAVENOUS | Status: AC | PRN

## 2016-07-16 MED ORDER — HYDRALAZINE HCL 20 MG/ML IJ SOLN: 5.0000 mg | INTRAMUSCULAR | Status: AC | PRN

## 2016-07-16 MED ORDER — SODIUM CHLORIDE 0.9% FLUSH
3.0000 mL | Freq: Two times a day (BID) | INTRAVENOUS | Status: DC
  Administered 2016-07-16: 3 mL via INTRAVENOUS

## 2016-07-16 MED ORDER — IOPAMIDOL (ISOVUE-300) INJECTION 61%
INTRAVENOUS | Status: DC | PRN
  Administered 2016-07-16: 315 mL via INTRA_ARTERIAL

## 2016-07-16 MED ORDER — TICAGRELOR 90 MG PO TABS: 90.0000 mg | ORAL_TABLET | Freq: Two times a day (BID) | ORAL | Status: DC

## 2016-07-16 MED ORDER — ASPIRIN 81 MG PO CHEW
81.0000 mg | CHEWABLE_TABLET | Freq: Every day | ORAL | Status: DC
  Administered 2016-07-17: 81 mg via ORAL
  Filled 2016-07-16: qty 1

## 2016-07-16 MED ORDER — ASPIRIN 81 MG PO CHEW
CHEWABLE_TABLET | ORAL | Status: AC
  Filled 2016-07-16: qty 4

## 2016-07-16 MED ORDER — SODIUM CHLORIDE 0.9 % WEIGHT BASED INFUSION: 1.0000 mL/kg/h | INTRAVENOUS | Status: AC

## 2016-07-16 MED ORDER — HEPARIN (PORCINE) IN NACL 2-0.9 UNIT/ML-% IJ SOLN
INTRAMUSCULAR | Status: AC
  Filled 2016-07-16: qty 500

## 2016-07-16 MED ORDER — HYDROCHLOROTHIAZIDE 12.5 MG PO CAPS
12.5000 mg | ORAL_CAPSULE | Freq: Every day | ORAL | Status: DC
  Administered 2016-07-17: 12.5 mg via ORAL
  Filled 2016-07-16: qty 1

## 2016-07-16 MED ORDER — ACETAMINOPHEN 325 MG PO TABS
650.0000 mg | ORAL_TABLET | ORAL | Status: DC | PRN
  Administered 2016-07-16: 650 mg via ORAL
  Filled 2016-07-16: qty 2

## 2016-07-16 MED ORDER — LOSARTAN POTASSIUM 50 MG PO TABS
100.0000 mg | ORAL_TABLET | Freq: Every day | ORAL | Status: DC
  Administered 2016-07-17: 100 mg via ORAL
  Filled 2016-07-16: qty 2

## 2016-07-16 MED ORDER — SODIUM CHLORIDE 0.9 % IV SOLN
INTRAVENOUS | Status: DC
  Administered 2016-07-16: 07:00:00 via INTRAVENOUS

## 2016-07-16 MED ORDER — FENTANYL CITRATE (PF) 100 MCG/2ML IJ SOLN
INTRAMUSCULAR | Status: DC | PRN
  Administered 2016-07-16: 25 ug via INTRAVENOUS

## 2016-07-16 MED ORDER — SODIUM CHLORIDE 0.9 % IV SOLN: 250.0000 mL | INTRAVENOUS | Status: DC | PRN

## 2016-07-16 MED ORDER — TICAGRELOR 90 MG PO TABS
ORAL_TABLET | ORAL | Status: DC | PRN
  Administered 2016-07-16: 180 mg via ORAL

## 2016-07-16 MED ORDER — SODIUM CHLORIDE 0.9 % IV SOLN
INTRAVENOUS | Status: DC | PRN
  Administered 2016-07-16: 1.75 mg/kg/h via INTRAVENOUS

## 2016-07-16 MED ORDER — ASPIRIN 81 MG PO CHEW
CHEWABLE_TABLET | ORAL | Status: DC | PRN
  Administered 2016-07-16: 324 mg via ORAL

## 2016-07-16 MED ORDER — FENTANYL CITRATE (PF) 100 MCG/2ML IJ SOLN
INTRAMUSCULAR | Status: AC
  Filled 2016-07-16: qty 2

## 2016-07-16 SURGICAL SUPPLY — 19 items
BALLN TREK RX 3.0X20 (BALLOONS) ×4
BALLN ~~LOC~~ TREK RX 3.75X12 (BALLOONS) ×4
BALLOON TREK RX 3.0X20 (BALLOONS) ×2 IMPLANT
BALLOON ~~LOC~~ TREK RX 3.75X12 (BALLOONS) ×2 IMPLANT
CATH 5FR JL4 DIAGNOSTIC (CATHETERS) ×3 IMPLANT
CATH 5FR PIGTAIL DIAGNOSTIC (CATHETERS) ×4 IMPLANT
CATH INFINITI JR4 5F (CATHETERS) ×4 IMPLANT
CATH VISTA GUIDE 6FR XB3.5 (CATHETERS) ×4 IMPLANT
DEVICE CLOSURE MYNXGRIP 6/7F (Vascular Products) ×4 IMPLANT
DEVICE INFLAT 30 PLUS (MISCELLANEOUS) ×4 IMPLANT
KIT MANI 3VAL PERCEP (MISCELLANEOUS) ×4 IMPLANT
NEEDLE PERC 18GX7CM (NEEDLE) ×4 IMPLANT
PACK CARDIAC CATH (CUSTOM PROCEDURE TRAY) ×4 IMPLANT
SHEATH AVANTI 5FR X 11CM (SHEATH) ×8 IMPLANT
SHEATH AVANTI 6FR X 11CM (SHEATH) ×4 IMPLANT
STENT XIENCE ALPINE RX 3.25X28 (Permanent Stent) ×4 IMPLANT
WIRE EMERALD 3MM-J .035X150CM (WIRE) ×4 IMPLANT
WIRE G HI TQ BMW 190 (WIRE) ×4 IMPLANT
WIRE HITORQ VERSACORE ST 145CM (WIRE) ×4 IMPLANT

## 2016-07-17 DIAGNOSIS — I25110 Atherosclerotic heart disease of native coronary artery with unstable angina pectoris: Secondary | ICD-10-CM | POA: Diagnosis not present

## 2016-07-17 MED ORDER — ROSUVASTATIN CALCIUM 40 MG PO TABS: 40.0000 mg | ORAL_TABLET | Freq: Every day | ORAL | 12 refills | Status: AC

## 2016-07-17 NOTE — Care Management Note (Signed)
Case Management Note  Patient Details  Name: Alejandro Arias MRN: [id number] Date of Birth: 13-Jun-1961  Subjective/Objective:   Discussed discharge planning with Alejandro Arias. He resides at home with his wife. PCP=Marshall Anderson. Pharmacy=[facility] on [address]. Resides at home with his wife who provides transportation per Alejandro Arias does not drive per history of stoke. No home assistive equipment other than a cane. No home oxygen. No home health services. If home health is ordered at discharge he prefers [facility] because they are in network with [place]. Case management will follow for discharge planning.                 Action/Plan:   Expected Discharge Date:                  Expected Discharge Plan:     In-House Referral:     Discharge planning Services     Post Acute Care Choice:    Choice offered to:     DME Arranged:    DME Agency:     HH Arranged:    HH Agency:     Status of Service:     If discussed at [employer] of Stay Meetings, dates discussed:    Additional Comments:  Eathen Budreau A, RN 07/17/2016, 1:08 PM

## 2016-07-17 NOTE — Final Progress Note (Signed)
Physician Final Progress Note  Patient ID: ZAFAR GIANNI MRN: [id number] DOB/AGE: 03-07-1961 55 y.o.  Admit date: 07/16/2016 Admitting provider: Yolonda Kida, MD Discharge date: 07/17/2016   Admission Diagnoses: unstable angina CAD s/p PCI stent DES LCx  Discharge Diagnoses:  Active Problems:   S/P angioplasty with stent  CAD HTN Unstable angina  Consults: None  Significant Findings/ Diagnostic Studies: angiography: cardiac cath with EF=50% ant/lat hypo Significant CAD 90% mid circ, distal LAD 90% , 99% ostial Dx 1 ( not amenable to PCI)  Procedures: Cardiac cath/PCI stent Lcx  Discharge Condition: good  Disposition: 01-Home or Self Care  Diet: Cardiac diet  Discharge Activity: Activity as tolerated and no driving for today  Discharge Instructions    AMB Referral to Cardiac Rehabilitation - Phase II    Complete by:  As directed    Diagnosis:  Coronary Stents        Total time spent taking care of this patient: 30 minutes  Signed: Dwayne D Callwood 07/17/2016, 11:09 AM

## 2016-07-17 NOTE — Discharge Instructions (Signed)
Femoral Site Care Introduction Refer to this sheet in the next few weeks. These instructions provide you with information about caring for yourself after your procedure. Your health care provider may also give you more specific instructions. Your treatment has been planned according to current medical practices, but problems sometimes occur. Call your health care provider if you have any problems or questions after your procedure. What can I expect after the procedure? After your procedure, it is typical to have the following:  Bruising at the site that usually fades within 1-2 weeks.  Blood collecting in the tissue (hematoma) that may be painful to the touch. It should usually decrease in size and tenderness within 1-2 weeks. Follow these instructions at home:  Take medicines only as directed by your health care provider.  You may shower 24-48 hours after the procedure or as directed by your health care provider. Remove the bandage (dressing) and gently wash the site with plain soap and water. Pat the area dry with a clean towel. Do not rub the site, because this may cause bleeding.  Do not take baths, swim, or use a hot tub until your health care provider approves.  Check your insertion site every day for redness, swelling, or drainage.  Do not apply powder or lotion to the site.  Limit use of stairs to twice a day for the first 2-3 days or as directed by your health care provider.  Do not squat for the first 2-3 days or as directed by your health care provider.  Do not lift over 10 lb (4.5 kg) for 5 days after your procedure or as directed by your health care provider.  Ask your health care provider when it is okay to:  Return to work or school.  Resume usual physical activities or sports.  Resume sexual activity.  Do not drive home if you are discharged the same day as the procedure. Have someone else drive you.  You may drive 24 hours after the procedure unless otherwise  instructed by your health care provider.  Do not operate machinery or power tools for 24 hours after the procedure or as directed by your health care provider.  If your procedure was done as an outpatient procedure, which means that you went home the same day as your procedure, a responsible adult should be with you for the first 24 hours after you arrive home.  Keep all follow-up visits as directed by your health care provider. This is important. Contact a health care provider if:  You have a fever.  You have chills.  You have increased bleeding from the site. Hold pressure on the site. Get help right away if:  You have unusual pain at the site.  You have redness, warmth, or swelling at the site.  You have drainage (other than a small amount of blood on the dressing) from the site.  The site is bleeding, and the bleeding does not stop after 30 minutes of holding steady pressure on the site.  Your leg or foot becomes pale, cool, tingly, or numb. This information is not intended to replace advice given to you by your health care provider. Make sure you discuss any questions you have with your health care provider. Document Released: 03/16/2014 Document Revised: 12/19/2015 Document Reviewed: 01/30/2014  2017 Elsevier Coronary Angiogram With Stent, Care After This sheet gives you information about how to care for yourself after your procedure. Your health care provider may also give you more specific instructions. If you  have problems or questions, contact your health care provider. What can I expect after the procedure? After your procedure, it is common to have:  Bruising in the area where a small, thin tube (catheter) was inserted. This usually fades within 1-2 weeks.  Blood collecting in the tissue (hematoma) that may be painful to the touch. It should usually decrease in size and tenderness within 1-2 weeks. Follow these instructions at home: Insertion area care  Do not take  baths, swim, or use a hot tub until your health care provider approves.  You may shower 24-48 hours after the procedure or as directed by your health care provider.  Follow instructions from your health care provider about how to take care of your incision. Make sure you:  Wash your hands with soap and water before you change your bandage (dressing). If soap and water are not available, use hand sanitizer.  Change your dressing as told by your health care provider.  Leave stitches (sutures), skin glue, or adhesive strips in place. These skin closures may need to stay in place for 2 weeks or longer. If adhesive strip edges start to loosen and curl up, you may trim the loose edges. Do not remove adhesive strips completely unless your health care provider tells you to do that.  Remove the bandage (dressing) and gently wash the catheter insertion site with plain soap and water.  Pat the area dry with a clean towel. Do not rub the area, because that may cause bleeding.  Do not apply powder or lotion to the incision area.  Check your incision area every day for signs of infection. Check for:  More redness, swelling, or pain.  More fluid or blood.  Warmth.  Pus or a bad smell. Activity  Do not drive for 24 hours if you were given a medicine to help you relax (sedative).  Do not lift anything that is heavier than 10 lb (4.5 kg) for 5 days after your procedure or as directed by your health care provider.  Ask your health care provider when it is okay for you:  To return to work or school.  To resume usual physical activities or sports.  To resume sexual activity. Eating and drinking  Eat a heart-healthy diet. This should include plenty of fresh fruits and vegetables.  Avoid the following types of food:  Food that is high in salt.  Canned or highly processed food.  Food that is high in saturated fat or sugar.  Fried food.  Limit alcohol intake to no more than 1 drink a day  for non-pregnant women and 2 drinks a day for men. One drink equals 12 oz of beer, 5 oz of wine, or 1 oz of hard liquor. Lifestyle  Do not use any products that contain nicotine or tobacco, such as cigarettes and e-cigarettes. If you need help quitting, ask your health care provider.  Take steps to manage and control your weight.  Get regular exercise.  Manage your blood pressure.  Manage other health problems, such as diabetes. General instructions  Take over-the-counter and prescription medicines only as told by your health care provider. Blood thinners may be prescribed after your procedure to improve blood flow through the stent.  If you need an MRI after your heart stent has been placed, be sure to tell the health care provider who orders the MRI that you have a heart stent.  Keep all follow-up visits as directed by your health care provider. This is important. Contact  a health care provider if:  You have a fever.  You have chills.  You have increased bleeding from the catheter insertion area. Hold pressure on the area. Get help right away if:  You develop chest pain or shortness of breath.  You feel faint or you pass out.  You have unusual pain at the catheter insertion area.  You have redness, warmth, or swelling at the catheter insertion area.  You have drainage (other than a small amount of blood on the dressing) from the catheter insertion area.  The catheter insertion area is bleeding, and the bleeding does not stop after 30 minutes of holding steady pressure on the area.  You develop bleeding from any other place, such as from your rectum. There may be bright red blood in your urine or stool, or it may appear as black, tarry stool. This information is not intended to replace advice given to you by your health care provider. Make sure you discuss any questions you have with your health care provider. Document Released: 01/30/2005 Document Revised: 04/09/2016  Document Reviewed: 04/09/2016 Elsevier Interactive Patient Education  2017 Reading. Patient is s/p cardiac cath and PCI stent DES to Circ He feel well and is ready for d/c.

## 2016-07-17 NOTE — Progress Notes (Signed)
Patient  IV removed and catheter intact. All discharge instructions given and patient verbalizes understanding. Tele removed and returned. Prescriptions given to patient No distress noted.

## 2017-10-20 ENCOUNTER — Encounter: Payer: Self-pay | Admitting: *Deleted

## 2017-10-21 ENCOUNTER — Ambulatory Visit: Payer: Federal, State, Local not specified - PPO | Admitting: Certified Registered Nurse Anesthetist

## 2017-10-21 ENCOUNTER — Ambulatory Visit
Admission: RE | Admit: 2017-10-21 | Discharge: 2017-10-21 | Disposition: A | Discharge status: Home / Self Care | Payer: Federal, State, Local not specified - PPO | Source: Ambulatory Visit | Attending: Gastroenterology | Admitting: Gastroenterology

## 2017-10-21 ENCOUNTER — Encounter
Admission: RE | Disposition: A | Discharge status: Home / Self Care | Payer: Self-pay | Source: Ambulatory Visit | Attending: Gastroenterology

## 2017-10-21 ENCOUNTER — Encounter: Payer: Self-pay | Admitting: Anesthesiology

## 2017-10-21 DIAGNOSIS — I739 Peripheral vascular disease, unspecified: Secondary | ICD-10-CM | POA: Diagnosis not present

## 2017-10-21 DIAGNOSIS — I10 Essential (primary) hypertension: Secondary | ICD-10-CM | POA: Insufficient documentation

## 2017-10-21 DIAGNOSIS — Z800 Family history of malignant neoplasm of digestive organs: Secondary | ICD-10-CM | POA: Diagnosis not present

## 2017-10-21 DIAGNOSIS — E785 Hyperlipidemia, unspecified: Secondary | ICD-10-CM | POA: Insufficient documentation

## 2017-10-21 DIAGNOSIS — Z1211 Encounter for screening for malignant neoplasm of colon: Secondary | ICD-10-CM | POA: Insufficient documentation

## 2017-10-21 DIAGNOSIS — Z8673 Personal history of transient ischemic attack (TIA), and cerebral infarction without residual deficits: Secondary | ICD-10-CM | POA: Diagnosis not present

## 2017-10-21 DIAGNOSIS — Z7902 Long term (current) use of antithrombotics/antiplatelets: Secondary | ICD-10-CM | POA: Insufficient documentation

## 2017-10-21 DIAGNOSIS — I671 Cerebral aneurysm, nonruptured: Secondary | ICD-10-CM | POA: Diagnosis not present

## 2017-10-21 DIAGNOSIS — Z79899 Other long term (current) drug therapy: Secondary | ICD-10-CM | POA: Diagnosis not present

## 2017-10-21 DIAGNOSIS — Z955 Presence of coronary angioplasty implant and graft: Secondary | ICD-10-CM | POA: Diagnosis not present

## 2017-10-21 DIAGNOSIS — Z7982 Long term (current) use of aspirin: Secondary | ICD-10-CM | POA: Diagnosis not present

## 2017-10-21 DIAGNOSIS — K5730 Diverticulosis of large intestine without perforation or abscess without bleeding: Secondary | ICD-10-CM | POA: Insufficient documentation

## 2017-10-21 DIAGNOSIS — Z85828 Personal history of other malignant neoplasm of skin: Secondary | ICD-10-CM | POA: Insufficient documentation

## 2017-10-21 HISTORY — DX: Varicella without complication: B01.9

## 2017-10-21 HISTORY — DX: Other amnesia: R41.3

## 2017-10-21 HISTORY — DX: Hyperlipidemia, unspecified: E78.5

## 2017-10-21 HISTORY — DX: Malignant (primary) neoplasm, unspecified: C80.1

## 2017-10-21 HISTORY — DX: Allergy, unspecified, initial encounter: T78.40XA

## 2017-10-21 HISTORY — DX: Cerebral aneurysm, nonruptured: I67.1

## 2017-10-21 HISTORY — DX: Zoster without complications: B02.9

## 2017-10-21 HISTORY — PX: COLONOSCOPY WITH PROPOFOL: SHX5780

## 2017-10-21 SURGERY — COLONOSCOPY WITH PROPOFOL
Anesthesia: General

## 2017-10-21 MED ORDER — PROPOFOL 10 MG/ML IV BOLUS
INTRAVENOUS | Status: DC | PRN
  Administered 2017-10-21 (×2): 18 mg via INTRAVENOUS
  Administered 2017-10-21: 100 mg via INTRAVENOUS
  Administered 2017-10-21: 18 mg via INTRAVENOUS

## 2017-10-21 MED ORDER — PROPOFOL 500 MG/50ML IV EMUL
INTRAVENOUS | Status: DC | PRN
  Administered 2017-10-21: 140 ug/kg/min via INTRAVENOUS

## 2017-10-21 MED ORDER — PROPOFOL 500 MG/50ML IV EMUL
INTRAVENOUS | Status: AC
  Filled 2017-10-21: qty 50

## 2017-10-21 MED ORDER — SODIUM CHLORIDE 0.9 % IV SOLN
INTRAVENOUS | Status: DC
  Administered 2017-10-21: 1000 mL via INTRAVENOUS

## 2017-10-21 MED ORDER — LIDOCAINE HCL (PF) 2 % IJ SOLN
INTRAMUSCULAR | Status: AC
  Filled 2017-10-21: qty 10

## 2017-10-21 MED ORDER — LIDOCAINE HCL (CARDIAC) 20 MG/ML IV SOLN
INTRAVENOUS | Status: DC | PRN
  Administered 2017-10-21: 50 mg via INTRAVENOUS

## 2017-10-21 MED ORDER — SODIUM CHLORIDE 0.9 % IV SOLN: INTRAVENOUS | Status: DC

## 2017-10-21 NOTE — Anesthesia Preprocedure Evaluation (Addendum)
Anesthesia Evaluation  Patient identified by MRN, date of birth, ID band Patient awake    Reviewed: Allergy & Precautions, NPO status , Patient's Chart, lab work & pertinent test results, reviewed documented beta blocker date and time   Airway Mallampati: III  TM Distance: >3 FB     Dental  (+) Chipped   Pulmonary asthma ,           Cardiovascular hypertension, Pt. on medications and Pt. on home beta blockers + Cardiac Stents and + Peripheral Vascular Disease       Neuro/Psych CVA, No Residual Symptoms    GI/Hepatic   Endo/Other    Renal/GU      Musculoskeletal   Abdominal   Peds  Hematology   Anesthesia Other Findings Multiple strokes, embolic and hemorrhagic.  Reproductive/Obstetrics                            Anesthesia Physical Anesthesia Plan  ASA: III  Anesthesia Plan: General   Post-op Pain Management:    Induction: Intravenous  PONV Risk Score and Plan:   Airway Management Planned:   Additional Equipment:   Intra-op Plan:   Post-operative Plan:   Informed Consent: I have reviewed the patients History and Physical, chart, labs and discussed the procedure including the risks, benefits and alternatives for the proposed anesthesia with the patient or authorized representative who has indicated his/her understanding and acceptance.     Plan Discussed with: CRNA  Anesthesia Plan Comments:         Anesthesia Quick Evaluation

## 2017-10-21 NOTE — Anesthesia Post-op Follow-up Note (Signed)
Anesthesia QCDR form completed.        

## 2017-10-21 NOTE — Anesthesia Postprocedure Evaluation (Signed)
Anesthesia Post Note  Patient: Alejandro Arias  Procedure(s) Performed: COLONOSCOPY WITH PROPOFOL (N/A )  Patient location during evaluation: Endoscopy Anesthesia Type: General Level of consciousness: awake and alert Pain management: pain level controlled Vital Signs Assessment: post-procedure vital signs reviewed and stable Respiratory status: spontaneous breathing, nonlabored ventilation, respiratory function stable and patient connected to nasal cannula oxygen Cardiovascular status: blood pressure returned to baseline and stable Postop Assessment: no apparent nausea or vomiting Anesthetic complications: no     Last Vitals:  Vitals:   10/21/17 1222 10/21/17 1232  BP: (!) 132/101 (!) 129/103  Pulse: 86 71  Resp: (!) 25 15  Temp:    SpO2: 96% 97%    Last Pain:  Vitals:   10/21/17 1232  TempSrc:   PainSc: 0-No pain                 Martha Clan

## 2017-10-21 NOTE — Transfer of Care (Signed)
Immediate Anesthesia Transfer of Care Note  Patient: Alejandro Arias  Procedure(s) Performed: COLONOSCOPY WITH PROPOFOL (N/A )  Patient Location: PACU and Endoscopy Unit  Anesthesia Type:General  Level of Consciousness: drowsy  Airway & Oxygen Therapy: Patient Spontanous Breathing and Patient connected to nasal cannula oxygen  Post-op Assessment: Report given to RN and Post -op Vital signs reviewed and stable  Post vital signs: Reviewed and stable  Last Vitals:  Vitals Value Taken Time  BP 126/96 10/21/2017 12:11 PM  Temp    Pulse 85 10/21/2017 12:12 PM  Resp 21 10/21/2017 12:12 PM  SpO2 97 % 10/21/2017 12:12 PM  Vitals shown include unvalidated device data.  Last Pain:  Vitals:   10/21/17 1014  TempSrc: Tympanic  PainSc: 0-No pain         Complications: No apparent anesthesia complications

## 2017-10-21 NOTE — Op Note (Signed)
[facility] Patient Name: Alejandro Arias Procedure Date: 10/21/2017 11:31 AM MRN: [id number] Account #: [id number] Date of Birth: 1961-03-21 Admit Type: Outpatient Age: 57 Room: [facility] ENDO ROOM 1 Gender: Male Note Status: Finalized Procedure:            Colonoscopy Indications:          Family history of colon cancer in a first-degree                        relative Providers:            [name], MD Referring MD:         Ocie Cornfield. Ouida Sills MD, MD (Referring MD) Medicines:            Monitored Anesthesia Care Complications:        No immediate complications. Procedure:            Pre-Anesthesia Assessment:                       - ASA Grade Assessment: III - A patient with severe                        systemic disease.                       After obtaining informed consent, the colonoscope was                        passed under direct vision. Throughout the procedure,                        the patient's blood pressure, pulse, and oxygen                        saturations were monitored continuously. The                        Colonoscope was introduced through the anus and                        advanced to the the cecum, identified by appendiceal                        orifice and ileocecal valve. The colonoscopy was                        extremely difficult due to poor bowel prep with stool                        present. The patient tolerated the procedure well. Findings:      Multiple medium-mouthed diverticula were found in the sigmoid colon,       descending colon and transverse colon.      The retroflexed view of the distal rectum and anal verge was normal and       showed no anal or rectal abnormalities.      A large amount of semi-liquid semi-solid stool was found in the       transverse colon, in the ascending colon and in the cecum, precluding       visualization.      The digital rectal exam was normal.  Impression:            - Diverticulosis in the sigmoid colon, in the                        descending colon and in the transverse colon.                       - The distal rectum and anal verge are normal on                        retroflexion view.                       - Stool in the transverse colon, in the ascending colon                        and in the cecum.                       - No specimens collected. Recommendation:       - Discharge patient to home.                       - Repeat colonoscopy in 6 months because the bowel                        preparation was poor. Procedure Code(s):    --- Professional ---                       [phone], Colonoscopy, flexible; diagnostic, including                        collection of specimen(s) by brushing or washing, when                        performed (separate procedure) Diagnosis Code(s):    --- Professional ---                       Z80.0, Family history of malignant neoplasm of                        digestive organs                       K57.30, Diverticulosis of large intestine without                        perforation or abscess without bleeding CPT copyright 2016 American Medical Association. All rights reserved. The codes documented in this report are preliminary and upon coder review may  be revised to meet current compliance requirements. [name], MD 10/21/2017 12:11:50 PM This report has been signed electronically. Number of Addenda: 0 Note Initiated On: 10/21/2017 11:31 AM Scope Withdrawal Time: 0 hours 3 minutes 17 seconds  Total Procedure Duration: 0 hours 30 minutes 9 seconds       [facility]

## 2017-10-21 NOTE — H&P (Signed)
Outpatient short stay form Pre-procedure 10/21/2017 11:30 AM Alejandro Sails MD  Primary Physician: Dr. [name]  Reason for visit: Colonoscopy  History of present illness: Patient is a 57 year old male presenting today as above.  He has a family history of colon cancer in primary relative, mother.  Patient tolerated this prep well.  He takes Plavix and 81 mg aspirin.  He has held the Plavix for about a week.  Further discussion he states he does not take aspirin, although it is listed.    Current Facility-Administered Medications:  .  0.9 %  sodium chloride infusion, , Intravenous, Continuous, Alejandro Sails, MD, Last Rate: 20 mL/hr at 10/21/17 1033, 1,000 mL at 10/21/17 1033 .  0.9 %  sodium chloride infusion, , Intravenous, Continuous, Alejandro Sails, MD  Medications Prior to Admission  Medication Sig Dispense Refill Last Dose  . amLODipine (NORVASC) 10 MG tablet Take 10 mg by mouth daily.  2 Past Week at Unknown time  . aspirin EC 81 MG tablet Take 81 mg by mouth daily.   Past Week at Unknown time  . clopidogrel (PLAVIX) 75 MG tablet Take 75 mg by mouth daily.   Past Week at Unknown time  . isosorbide mononitrate (IMDUR) 30 MG 24 hr tablet Take 30 mg by mouth daily.   Past Week at Unknown time  . losartan-hydrochlorothiazide (HYZAAR) 100-12.5 MG per tablet Take 1 tablet by mouth daily.   Past Week at Unknown time  . metoprolol succinate (TOPROL-XL) 25 MG 24 hr tablet Take 25 mg by mouth daily.   Past Week at Unknown time  . naproxen sodium (ANAPROX) 220 MG tablet Take 440 mg by mouth daily as needed (headache/pain).   Past Week at Unknown time  . nitroGLYCERIN (NITROSTAT) 0.4 MG SL tablet Place 0.4 mg under the tongue every 5 (five) minutes as needed for chest pain.   Past Week at Unknown time  . vitamin B-12 (CYANOCOBALAMIN) 500 MCG tablet Take 500 mcg by mouth daily.   Past Week at Unknown time  . rosuvastatin (CRESTOR) 40 MG tablet Take 1 tablet (40 mg total) by  mouth daily at 6 PM. 30 tablet 12      Allergies  Allergen Reactions  . Lipitor [Atorvastatin Calcium]   . Morphine And Related Nausea And Vomiting     Past Medical History:  Diagnosis Date  . Allergic state   . 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 Asthma   . Cancer ([facility])    Skin  . Cerebral aneurysm   . Cerebral aneurysm   . Chicken pox   . Hyperlipemia   . Hyperlipidemia   . Hypertension   . Shingles   . Short-term memory loss   . Stroke [facility])     Review of systems:      Physical Exam    Heart and lungs: Regular rate and rhythm without rub or gallop, lungs are bilaterally clear.    HEENT: Normocephalic atraumatic eyes are anicteric    Other:    Pertinant exam for procedure: Soft nontender nondistended bowel sounds positive normoactive.    Planned proceedures: Colonoscopy and indicated procedures. I have discussed the risks benefits and complications of procedures to include not limited to bleeding, infection, perforation and the risk of sedation and the patient wishes to proceed.    Alejandro Sails, MD Gastroenterology 10/21/2017  11:30 AM

## 2017-10-25 ENCOUNTER — Encounter: Payer: Self-pay | Admitting: Gastroenterology

## 2018-08-25 ENCOUNTER — Encounter: Payer: Self-pay | Admitting: Student

## 2018-08-26 ENCOUNTER — Other Ambulatory Visit: Payer: Self-pay

## 2018-08-26 ENCOUNTER — Ambulatory Visit: Payer: Federal, State, Local not specified - PPO | Admitting: Anesthesiology

## 2018-08-26 ENCOUNTER — Encounter
Admission: RE | Disposition: A | Discharge status: Home / Self Care | Payer: Self-pay | Source: Ambulatory Visit | Attending: Gastroenterology

## 2018-08-26 ENCOUNTER — Encounter: Payer: Self-pay | Admitting: Anesthesiology

## 2018-08-26 ENCOUNTER — Ambulatory Visit
Admission: RE | Admit: 2018-08-26 | Discharge: 2018-08-26 | Disposition: A | Discharge status: Home / Self Care | Payer: Federal, State, Local not specified - PPO | Source: Ambulatory Visit | Attending: Gastroenterology | Admitting: Gastroenterology

## 2018-08-26 DIAGNOSIS — Z1211 Encounter for screening for malignant neoplasm of colon: Secondary | ICD-10-CM | POA: Diagnosis present

## 2018-08-26 DIAGNOSIS — Z79899 Other long term (current) drug therapy: Secondary | ICD-10-CM | POA: Insufficient documentation

## 2018-08-26 DIAGNOSIS — K635 Polyp of colon: Secondary | ICD-10-CM | POA: Insufficient documentation

## 2018-08-26 DIAGNOSIS — K5730 Diverticulosis of large intestine without perforation or abscess without bleeding: Secondary | ICD-10-CM | POA: Diagnosis not present

## 2018-08-26 DIAGNOSIS — E785 Hyperlipidemia, unspecified: Secondary | ICD-10-CM | POA: Diagnosis not present

## 2018-08-26 DIAGNOSIS — Z7902 Long term (current) use of antithrombotics/antiplatelets: Secondary | ICD-10-CM | POA: Diagnosis not present

## 2018-08-26 DIAGNOSIS — K219 Gastro-esophageal reflux disease without esophagitis: Secondary | ICD-10-CM | POA: Diagnosis not present

## 2018-08-26 DIAGNOSIS — J45909 Unspecified asthma, uncomplicated: Secondary | ICD-10-CM | POA: Insufficient documentation

## 2018-08-26 DIAGNOSIS — K640 First degree hemorrhoids: Secondary | ICD-10-CM | POA: Insufficient documentation

## 2018-08-26 DIAGNOSIS — Z800 Family history of malignant neoplasm of digestive organs: Secondary | ICD-10-CM | POA: Insufficient documentation

## 2018-08-26 DIAGNOSIS — I10 Essential (primary) hypertension: Secondary | ICD-10-CM | POA: Diagnosis not present

## 2018-08-26 DIAGNOSIS — Z8673 Personal history of transient ischemic attack (TIA), and cerebral infarction without residual deficits: Secondary | ICD-10-CM | POA: Diagnosis not present

## 2018-08-26 DIAGNOSIS — Z85828 Personal history of other malignant neoplasm of skin: Secondary | ICD-10-CM | POA: Insufficient documentation

## 2018-08-26 DIAGNOSIS — Z7982 Long term (current) use of aspirin: Secondary | ICD-10-CM | POA: Diagnosis not present

## 2018-08-26 HISTORY — PX: COLONOSCOPY WITH PROPOFOL: SHX5780

## 2018-08-26 HISTORY — DX: Gastro-esophageal reflux disease without esophagitis: K21.9

## 2018-08-26 HISTORY — DX: Cerebrovascular disease, unspecified: I67.9

## 2018-08-26 SURGERY — COLONOSCOPY WITH PROPOFOL
Anesthesia: General

## 2018-08-26 MED ORDER — PROPOFOL 10 MG/ML IV BOLUS
INTRAVENOUS | Status: DC | PRN
  Administered 2018-08-26: 80 mg via INTRAVENOUS
  Administered 2018-08-26: 50 mg via INTRAVENOUS
  Administered 2018-08-26: 80 mg via INTRAVENOUS

## 2018-08-26 MED ORDER — PROPOFOL 500 MG/50ML IV EMUL
INTRAVENOUS | Status: AC
  Filled 2018-08-26: qty 50

## 2018-08-26 MED ORDER — SODIUM CHLORIDE 0.9 % IV SOLN
INTRAVENOUS | Status: DC
  Administered 2018-08-26: 14:00:00 via INTRAVENOUS

## 2018-08-26 MED ORDER — PROPOFOL 500 MG/50ML IV EMUL
INTRAVENOUS | Status: DC | PRN
  Administered 2018-08-26: 80 ug/kg/min via INTRAVENOUS

## 2018-08-26 NOTE — Transfer of Care (Signed)
Immediate Anesthesia Transfer of Care Note  Patient: Alejandro Arias  Procedure(s) Performed: COLONOSCOPY WITH PROPOFOL (N/A )  Patient Location: PACU  Anesthesia Type:General  Level of Consciousness: awake  Airway & Oxygen Therapy: Patient Spontanous Breathing  Post-op Assessment: Report given to RN  Post vital signs: stable  Last Vitals:  Vitals Value Taken Time  BP 129/92 08/26/2018  2:35 PM  Temp 36.2 C 08/26/2018  2:35 PM  Pulse 82 08/26/2018  2:35 PM  Resp 13 08/26/2018  2:35 PM  SpO2 98 % 08/26/2018  2:35 PM    Last Pain:  Vitals:   08/26/18 1435  TempSrc: Tympanic  PainSc: Asleep         Complications: No apparent anesthesia complications

## 2018-08-26 NOTE — Op Note (Signed)
[facility] Gastroenterology Patient Name: Alejandro Arias Procedure Date: 08/26/2018 1:48 PM MRN: [id number] Account #: [id number] Date of Birth: May 09, 1961 Admit Type: Outpatient Age: 58 Room: [facility] ENDO ROOM 3 Gender: Male Note Status: Finalized Procedure:            Colonoscopy Indications:          Family history of colon cancer in a first-degree                        relative Providers:            [name], MD Referring MD:         Ocie Cornfield. Ouida Sills MD, MD (Referring MD) Medicines:            Monitored Anesthesia Care Complications:        No immediate complications. Procedure:            Pre-Anesthesia Assessment:                       - ASA Grade Assessment: III - A patient with severe                        systemic disease.                       After obtaining informed consent, the colonoscope was                        passed under direct vision. Throughout the procedure,                        the patient's blood pressure, pulse, and oxygen                        saturations were monitored continuously. The                        Colonoscope was introduced through the anus and                        advanced to the the cecum, identified by appendiceal                        orifice and ileocecal valve. The colonoscopy was                        performed without difficulty. The patient tolerated the                        procedure well. The quality of the bowel preparation                        was good except the ascending colon was fair. Findings:      A 1 mm polyp was found in the proximal ascending colon. The polyp was       sessile. The polyp was removed with a cold biopsy forceps. Resection and       retrieval were complete.      Many small and large-mouthed diverticula were found in the sigmoid       colon, descending colon, transverse colon and ascending colon.  Non-bleeding internal hemorrhoids were found during  retroflexion. The       hemorrhoids were small and Grade I (internal hemorrhoids that do not       prolapse).      The digital rectal exam was normal. Impression:           - One 1 mm polyp in the proximal ascending colon,                        removed with a cold biopsy forceps. Resected and                        retrieved.                       - Diverticulosis in the sigmoid colon, in the                        descending colon, in the transverse colon and in the                        ascending colon.                       - Non-bleeding internal hemorrhoids. Recommendation:       - Discharge patient to home.                       - Soft diet today, then advance as tolerated to advance                        diet as tolerated. Procedure Code(s):    --- Professional ---                       [phone], Colonoscopy, flexible; with biopsy, single or                        multiple Diagnosis Code(s):    --- Professional ---                       D12.2, Benign neoplasm of ascending colon                       K64.0, First degree hemorrhoids                       Z80.0, Family history of malignant neoplasm of                        digestive organs                       K57.30, Diverticulosis of large intestine without                        perforation or abscess without bleeding CPT copyright 2018 American Medical Association. All rights reserved. The codes documented in this report are preliminary and upon coder review may  be revised to meet current compliance requirements. [name], MD 08/26/2018 2:35:12 PM This report has been signed electronically. Number of Addenda: 0 Note Initiated On: 08/26/2018 1:48 PM Scope Withdrawal Time: 0 hours 10 minutes 25 seconds  Total  Procedure Duration: 0 hours 21 minutes 33 seconds       [facility]

## 2018-08-26 NOTE — Anesthesia Post-op Follow-up Note (Signed)
Anesthesia QCDR form completed.        

## 2018-08-26 NOTE — Anesthesia Postprocedure Evaluation (Signed)
Anesthesia Post Note  Patient: Alejandro Arias  Procedure(s) Performed: COLONOSCOPY WITH PROPOFOL (N/A )  Patient location during evaluation: Endoscopy Anesthesia Type: General Level of consciousness: awake and alert Pain management: pain level controlled Vital Signs Assessment: post-procedure vital signs reviewed and stable Respiratory status: spontaneous breathing and respiratory function stable Cardiovascular status: stable Anesthetic complications: no     Last Vitals:  Vitals:   08/26/18 1435 08/26/18 1438  BP: (!) 129/92 (!) 129/92  Pulse: 82 80  Resp: 13 18  Temp: (!) 36.2 C (!) 36.3 C  SpO2: 98% 96%    Last Pain:  Vitals:   08/26/18 1435  TempSrc: Tympanic  PainSc: Asleep                 Markez Dowland K

## 2018-08-26 NOTE — H&P (Signed)
Outpatient short stay form Pre-procedure 08/26/2018 2:02 PM [name] MD  Primary Physician: [name], md  Reason for visit: Colonoscopy  History of present illness: Patient is a 58 year old male presenting today for colonoscopy in regards to his history of colon cancer in primary relative, mother.  He tolerated his prep well.  He takes Plavix and aspirin.  He has held his Plavix for 6 days.  He is also held his aspirin.  Aches no other aspirin products or blood thinner.  Patient presented for colonoscopy on 10/21/2017 however had a poor prep and it was not possible to evaluate his right colon adequately.    Current Facility-Administered Medications:  .  0.9 %  sodium chloride infusion, , Intravenous, Continuous, [name], MD  Facility-Administered Medications Ordered in Other Encounters:  .  propofol (DIPRIVAN) 500 MG/50ML infusion, , , Continuous PRN, Marcy Siren, CRNA, Last Rate: 41.4 mL/hr at 08/26/18 1359, 80 mcg/kg/min at 08/26/18 1359  Medications Prior to Admission  Medication Sig Dispense Refill Last Dose  . aspirin EC 81 MG tablet Take 81 mg by mouth daily.   Past Week at Unknown time  . clopidogrel (PLAVIX) 75 MG tablet Take 75 mg by mouth daily.   Past Week at Unknown time  . hydrochlorothiazide (HYDRODIURIL) 12.5 MG tablet Take 12.5 mg by mouth daily.   08/25/2018 at 0900  . losartan (COZAAR) 100 MG tablet Take 100 mg by mouth daily.   08/26/2018 at 0900  . metoprolol succinate (TOPROL-XL) 25 MG 24 hr tablet Take 25 mg by mouth daily.   08/26/2018 at 0900  . rosuvastatin (CRESTOR) 40 MG tablet Take 1 tablet (40 mg total) by mouth daily at 6 PM. 30 tablet 12 08/25/2018 at Unknown time  . vitamin B-12 (CYANOCOBALAMIN) 500 MCG tablet Take 500 mcg by mouth daily.   Past Week at Unknown time  . amLODipine (NORVASC) 10 MG tablet Take 10 mg by mouth daily.  2 Not Taking  . isosorbide mononitrate (IMDUR) 30 MG 24 hr tablet Take 30 mg by mouth daily.   Not  Taking at Unknown time  . losartan-hydrochlorothiazide (HYZAAR) 100-12.5 MG per tablet Take 1 tablet by mouth daily.   Not Taking at Unknown time  . naproxen sodium (ANAPROX) 220 MG tablet Take 440 mg by mouth daily as needed (headache/pain).   Past Week at Unknown time  . nitroGLYCERIN (NITROSTAT) 0.4 MG SL tablet Place 0.4 mg under the tongue every 5 (five) minutes as needed for chest pain.   Not Taking at Unknown time     Allergies  Allergen Reactions  . Lipitor [Atorvastatin Calcium]   . Morphine And Related Nausea And Vomiting     Past Medical History:  Diagnosis Date  . Allergic state   . 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 Cancer (HCC)    Skin  . Cerebral aneurysm   . Cerebral aneurysm   . Cerebral vascular disease   . Chicken pox   . GERD (gastroesophageal reflux disease)   . Hyperlipemia   . Hyperlipidemia   . Hypertension   . Shingles   . Short-term memory loss   . Stroke [facility])     Review of systems:      Physical Exam    Heart and lungs: Regular rate and rhythm without rub or gallop, lungs are bilaterally clear.    HEENT: Normocephalic atraumatic eyes are anicteric    Other:    Pertinant exam for procedure: Soft nontender nondistended  bowel sounds positive normoactive.    Planned proceedures: Colonoscopy and indicated procedures. I have discussed the risks benefits and complications of procedures to include not limited to bleeding, infection, perforation and the risk of sedation and the patient wishes to proceed.    [name], MD Gastroenterology 08/26/2018  2:02 PM

## 2018-08-26 NOTE — Anesthesia Preprocedure Evaluation (Signed)
Anesthesia Evaluation  Patient identified by MRN, date of birth, ID band Patient awake    Reviewed: Allergy & Precautions, NPO status , Patient's Chart, lab work & pertinent test results, reviewed documented beta blocker date and time   Airway Mallampati: II  TM Distance: >3 FB     Dental  (+) Chipped   Pulmonary asthma ,           Cardiovascular hypertension, Pt. on medications and Pt. on home beta blockers      Neuro/Psych CVA    GI/Hepatic   Endo/Other    Renal/GU      Musculoskeletal   Abdominal   Peds  Hematology   Anesthesia Other Findings   Reproductive/Obstetrics                             Anesthesia Physical Anesthesia Plan  ASA: III  Anesthesia Plan: General   Post-op Pain Management:    Induction: Intravenous  PONV Risk Score and Plan:   Airway Management Planned:   Additional Equipment:   Intra-op Plan:   Post-operative Plan:   Informed Consent: I have reviewed the patients History and Physical, chart, labs and discussed the procedure including the risks, benefits and alternatives for the proposed anesthesia with the patient or authorized representative who has indicated his/her understanding and acceptance.       Plan Discussed with: CRNA  Anesthesia Plan Comments:         Anesthesia Quick Evaluation

## 2018-08-29 ENCOUNTER — Encounter: Payer: Self-pay | Admitting: Gastroenterology

## 2018-08-31 LAB — SURGICAL PATHOLOGY

## 2018-11-30 ENCOUNTER — Encounter: Payer: Self-pay | Admitting: Emergency Medicine

## 2018-11-30 ENCOUNTER — Other Ambulatory Visit: Payer: Self-pay

## 2018-11-30 ENCOUNTER — Emergency Department: Payer: Federal, State, Local not specified - PPO

## 2018-11-30 ENCOUNTER — Emergency Department
Admission: EM | Admit: 2018-11-30 | Discharge: 2018-11-30 | Disposition: A | Discharge status: Home / Self Care | Payer: Federal, State, Local not specified - PPO | Attending: Emergency Medicine | Admitting: Emergency Medicine

## 2018-11-30 DIAGNOSIS — I10 Essential (primary) hypertension: Secondary | ICD-10-CM | POA: Insufficient documentation

## 2018-11-30 DIAGNOSIS — S61212A Laceration without foreign body of right middle finger without damage to nail, initial encounter: Secondary | ICD-10-CM | POA: Diagnosis not present

## 2018-11-30 DIAGNOSIS — Y9389 Activity, other specified: Secondary | ICD-10-CM | POA: Diagnosis not present

## 2018-11-30 DIAGNOSIS — Y92015 Private garage of single-family (private) house as the place of occurrence of the external cause: Secondary | ICD-10-CM | POA: Diagnosis not present

## 2018-11-30 DIAGNOSIS — E785 Hyperlipidemia, unspecified: Secondary | ICD-10-CM | POA: Diagnosis not present

## 2018-11-30 DIAGNOSIS — Y998 Other external cause status: Secondary | ICD-10-CM | POA: Diagnosis not present

## 2018-11-30 DIAGNOSIS — Z79899 Other long term (current) drug therapy: Secondary | ICD-10-CM | POA: Diagnosis not present

## 2018-11-30 DIAGNOSIS — S62662B Nondisplaced fracture of distal phalanx of right middle finger, initial encounter for open fracture: Secondary | ICD-10-CM | POA: Insufficient documentation

## 2018-11-30 DIAGNOSIS — W228XXA Striking against or struck by other objects, initial encounter: Secondary | ICD-10-CM | POA: Insufficient documentation

## 2018-11-30 DIAGNOSIS — S6991XA Unspecified injury of right wrist, hand and finger(s), initial encounter: Secondary | ICD-10-CM | POA: Diagnosis present

## 2018-11-30 MED ORDER — CEPHALEXIN 500 MG PO CAPS: 1000.0000 mg | ORAL_CAPSULE | Freq: Two times a day (BID) | ORAL | 0 refills | Status: DC

## 2018-11-30 MED ORDER — HYDROCODONE-ACETAMINOPHEN 5-325 MG PO TABS: 1.0000 | ORAL_TABLET | ORAL | 0 refills | Status: DC | PRN

## 2018-11-30 MED ORDER — TETANUS-DIPHTH-ACELL PERTUSSIS 5-2.5-18.5 LF-MCG/0.5 IM SUSP
0.5000 mL | Freq: Once | INTRAMUSCULAR | Status: AC
  Administered 2018-11-30: 0.5 mL via INTRAMUSCULAR
  Filled 2018-11-30: qty 0.5

## 2018-11-30 MED ORDER — LIDOCAINE HCL (PF) 1 % IJ SOLN
10.0000 mL | Freq: Once | INTRAMUSCULAR | Status: AC
  Administered 2018-11-30: 10 mL
  Filled 2018-11-30: qty 10

## 2018-11-30 NOTE — ED Provider Notes (Signed)
[facility] Emergency Department Provider Note  ____________________________________________  Time seen: Approximately 4:17 PM  I have reviewed the triage vital signs and the nursing notes.   HISTORY  Chief Complaint Laceration    HPI Alejandro Arias is a 58 y.o. male who presents emergency department complaining of a laceration to the ring finger of his right hand patient reports that he was in his garage cleaning when he attempted to move a refrigerator.  Patient reports that he slipped, fell and managed to cut his finger on an unknown object.  Laceration runs underneath the very distal aspect of the fingernail.  Bleeding controlled direct pressure.  Patient has good range of motion with extension and flexion.  Unsure when his last tetanus shot was.  No other injury or complaint.         Past Medical History:  Diagnosis Date  . Allergic state   . 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 Cancer (HCC)    Skin  . Cerebral aneurysm   . Cerebral aneurysm   . Cerebral vascular disease   . Chicken pox   . GERD (gastroesophageal reflux disease)   . Hyperlipemia   . Hyperlipidemia   . Hypertension   . Shingles   . Short-term memory loss   . Stroke [facility])     Patient Active Problem List   Diagnosis Date Noted  . S/P angioplasty with stent 07/16/2016    Past Surgical History:  Procedure Laterality Date  . APPENDECTOMY    . CARDIAC CATHETERIZATION Left 07/16/2016   Procedure: Left Heart Cath and Coronary Angiography;  Surgeon: Yolonda Kida, MD;  Location: [place] CV LAB;  Service: Cardiovascular;  Laterality: Left;  . CARDIAC CATHETERIZATION N/A 07/16/2016   Procedure: Coronary Stent Intervention;  Surgeon: Yolonda Kida, MD;  Location: [place] CV LAB;  Service: Cardiovascular;  Laterality: N/A;  . COLONOSCOPY    . COLONOSCOPY WITH PROPOFOL N/A 10/21/2017   Procedure: COLONOSCOPY WITH PROPOFOL;  Surgeon: [name], MD;  Location: [facility]  ENDOSCOPY;  Service: Endoscopy;  Laterality: N/A;  . COLONOSCOPY WITH PROPOFOL N/A 08/26/2018   Procedure: COLONOSCOPY WITH PROPOFOL;  Surgeon: [name], MD;  Location: [facility] ENDOSCOPY;  Service: Endoscopy;  Laterality: N/A;  . TONSILLECTOMY    . VASECTOMY      Prior to Admission medications   Medication Sig Start Date End Date Taking? Authorizing Provider  amLODipine (NORVASC) 10 MG tablet Take 10 mg by mouth daily. 07/21/15   [provider]  aspirin EC 81 MG tablet Take 81 mg by mouth daily.    [provider]  cephALEXin (KEFLEX) 500 MG capsule Take 2 capsules (1,000 mg total) by mouth 2 (two) times daily. 11/30/18   Cuthriell, Charline Bills, PA-C  clopidogrel (PLAVIX) 75 MG tablet Take 75 mg by mouth daily.    [provider]  hydrochlorothiazide (HYDRODIURIL) 12.5 MG tablet Take 12.5 mg by mouth daily.    [provider]  HYDROcodone-acetaminophen (NORCO/VICODIN) 5-325 MG tablet Take 1 tablet by mouth every 4 (four) hours as needed for moderate pain. 11/30/18   Cuthriell, Charline Bills, PA-C  isosorbide mononitrate (IMDUR) 30 MG 24 hr tablet Take 30 mg by mouth daily.    [provider]  losartan (COZAAR) 100 MG tablet Take 100 mg by mouth daily.    [provider]  losartan-hydrochlorothiazide (HYZAAR) 100-12.5 MG per tablet Take 1 tablet by mouth daily.    [provider]  metoprolol  succinate (TOPROL-XL) 25 MG 24 hr tablet Take 25 mg by mouth daily.    [provider]  naproxen sodium (ANAPROX) 220 MG tablet Take 440 mg by mouth daily as needed (headache/pain).    [provider]  nitroGLYCERIN (NITROSTAT) 0.4 MG SL tablet Place 0.4 mg under the tongue every 5 (five) minutes as needed for chest pain.    [provider]  rosuvastatin (CRESTOR) 40 MG tablet Take 1 tablet (40 mg total) by mouth daily at 6 PM. 07/17/16 08/26/18  Callwood, Dwayne D, MD  vitamin B-12 (CYANOCOBALAMIN) 500 MCG tablet Take  500 mcg by mouth daily.    [provider]    Allergies Lipitor [atorvastatin calcium] and Morphine and related  Family History  Problem Relation Age of Onset  . Colon cancer Mother   . Diabetes Mother   . Hypertension Mother   . Heart attack Father   . Seizures Father     Social History Social History   Tobacco Use  . Smoking status: Never Smoker  . Smokeless tobacco: Never Used  Substance Use Topics  . Alcohol use: Yes    Alcohol/week: 2.0 standard drinks    Types: 2 Cans of beer per week  . Drug use: No     Review of Systems  Constitutional: No fever/chills Eyes: No visual changes. Cardiovascular: no chest pain. Respiratory: no cough. No SOB. Gastrointestinal: No abdominal pain.  No nausea, no vomiting.   Musculoskeletal: Negative for musculoskeletal pain. Skin: Positive for laceration to the ring finger of the right hand Neurological: Negative for headaches, focal weakness or numbness. 10-point ROS otherwise negative.  ____________________________________________   PHYSICAL EXAM:  VITAL SIGNS: ED Triage Vitals [11/30/18 1501]  Enc Vitals Group     BP (!) 132/94     Pulse Rate 75     Resp 18     Temp 98 F (36.7 C)     Temp Source Oral     SpO2 97 %     Weight 195 lb (88.5 kg)     Height 5\' 11"  (1.803 m)     Head Circumference      Peak Flow      Pain Score 7     Pain Loc      Pain Edu?      Excl. in [place]?      Constitutional: Alert and oriented. Well appearing and in no acute distress. Eyes: Conjunctivae are normal. PERRL. EOMI. Head: Atraumatic. Neck: No stridor.    Cardiovascular: Normal rate, regular rhythm. Normal S1 and S2.  Good peripheral circulation. Respiratory: Normal respiratory effort without tachypnea or retractions. Lungs CTAB. Good air entry to the bases with no decreased or absent breath sounds. Musculoskeletal: Full range of motion to all extremities. No gross deformities appreciated.  Visualization of the ring finger  right hand reveals a ragged laceration starting on the medial aspect of the distal phalanx, transversing under the very distal aspect of the fingernail with no actual fingernail involvement, and extending into the lateral aspect of the finger.  Laceration measures are is approximately 4 cm in length.  No active bleeding. Neurologic:  Normal speech and language. No gross focal neurologic deficits are appreciated.  Skin:  Skin is warm, dry and intact. No rash noted. Psychiatric: Mood and affect are normal. Speech and behavior are normal. Patient exhibits appropriate insight and judgement.   ____________________________________________   LABS (all labs ordered are listed, but only abnormal results are displayed)  Labs Reviewed -  No data to display ____________________________________________  EKG   ____________________________________________  RADIOLOGY I personally viewed and evaluated these images as part of my medical decision making, as well as reviewing the written report by the radiologist.  Dg Hand Complete Right  Result Date: 11/30/2018 CLINICAL DATA:  Laceration right ring finger secondary to a trip and fall today. Initial encounter. EXAM: RIGHT HAND - COMPLETE 3+ VIEW COMPARISON:  None. FINDINGS: The patient has a fracture of the tuft of the right ring finger. Associated soft tissue wound consistent with laceration noted. No other acute bony abnormality. No foreign body. IMPRESSION: Open tuft fracture right ring finger Electronically Signed   By: Inge Rise M.D.   On: 11/30/2018 16:52    ____________________________________________    PROCEDURES  Procedure(s) performed:    Marland KitchenMarland KitchenLaceration Repair Date/Time: 11/30/2018 11:54 PM Performed by: Darletta Moll, PA-C Authorized by: Darletta Moll, PA-C   Consent:    Consent obtained:  Verbal   Consent given by:  Patient   Risks discussed:  Pain and poor cosmetic result Anesthesia (see MAR for exact dosages):     Anesthesia method:  Nerve block   Block needle gauge:  27 G   Block anesthetic:  Lidocaine 1% w/o epi   Block technique:  Digital block   Block injection procedure:  Anatomic landmarks identified, introduced needle, negative aspiration for blood and incremental injection Laceration details:    Location:  Finger   Finger location:  R long finger   Length (cm):  4 Repair type:    Repair type:  Intermediate Exploration:    Hemostasis achieved with:  Direct pressure   Wound exploration: wound explored through full range of motion and entire depth of wound probed and visualized     Wound extent: underlying fracture     Wound extent: no foreign bodies/material noted, no muscle damage noted, no nerve damage noted, no tendon damage noted and no vascular damage noted     Contaminated: no   Treatment:    Area cleansed with:  Betadine   Amount of cleaning:  Extensive   Irrigation solution:  Sterile saline Skin repair:    Repair method:  Sutures   Suture size:  4-0   Suture material:  Nylon   Suture technique:  Simple interrupted   Number of sutures:  8 Approximation:    Approximation:  Close Post-procedure details:    Dressing:  Splint for protection   Patient tolerance of procedure:  Tolerated well, no immediate complications      Medications  Tdap (BOOSTRIX) injection 0.5 mL (0.5 mLs Intramuscular Given 11/30/18 1755)  lidocaine (PF) (XYLOCAINE) 1 % injection 10 mL (10 mLs Infiltration Given 11/30/18 1755)     ____________________________________________   INITIAL IMPRESSION / ASSESSMENT AND PLAN / ED COURSE  Pertinent labs & imaging results that were available during my care of the patient were reviewed by me and considered in my medical decision making (see chart for details).  Review of the [place] CSRS was performed in accordance of the [place] prior to dispensing any controlled drugs.           Patient's diagnosis is consistent with finger laceration/open nondisplaced  fracture of the distal phalanx.  Patient presented to the emergency department with a laceration running along the distal aspect of the finger.  Area was sutured, splint applied.  Wound care instructions discussed with patient.  Patient is given limited pain medication and antibiotics at time of discharge.  Wound care follow-up with primary  care in 1 week for suture removal..Patient is given ED precautions to return to the ED for any worsening or new symptoms.     ____________________________________________  FINAL CLINICAL IMPRESSION(S) / ED DIAGNOSES  Final diagnoses:  Open nondisplaced fracture of distal phalanx of right middle finger, initial encounter      NEW MEDICATIONS STARTED DURING THIS VISIT:  ED Discharge Orders         Ordered    HYDROcodone-acetaminophen (NORCO/VICODIN) 5-325 MG tablet  Every 4 hours PRN     11/30/18 1800    cephALEXin (KEFLEX) 500 MG capsule  2 times daily     11/30/18 1800              This chart was dictated using voice recognition software/Dragon. Despite best efforts to proofread, errors can occur which can change the meaning. Any change was purely unintentional.    Darletta Moll, PA-C 11/30/18 2356    Nance Pear, MD 12/03/18 [phone]

## 2018-11-30 NOTE — ED Triage Notes (Signed)
Pt presnets to ED via POV with c/o laceration to R ring finger. Bleeding controlled upon arrival to triage. Pt states unknown when last tetanus shot was .

## 2018-11-30 NOTE — ED Notes (Signed)
See triage note  Presents with laceration to right 4 th finger  States he was cleaning out the garage  Laceration to tip of finger  Bleeding controlled at present

## 2019-09-08 ENCOUNTER — Other Ambulatory Visit: Payer: Self-pay | Admitting: Student

## 2019-09-08 DIAGNOSIS — M25522 Pain in left elbow: Secondary | ICD-10-CM

## 2019-09-11 ENCOUNTER — Ambulatory Visit
Admission: RE | Admit: 2019-09-11 | Discharge: 2019-09-11 | Disposition: A | Discharge status: Home / Self Care | Payer: Federal, State, Local not specified - PPO | Source: Ambulatory Visit | Attending: Student | Admitting: Student

## 2019-09-11 ENCOUNTER — Other Ambulatory Visit: Payer: Self-pay

## 2019-09-11 DIAGNOSIS — M25522 Pain in left elbow: Secondary | ICD-10-CM | POA: Insufficient documentation

## 2019-09-12 ENCOUNTER — Other Ambulatory Visit: Payer: Federal, State, Local not specified - PPO

## 2019-09-12 ENCOUNTER — Encounter
Admission: RE | Admit: 2019-09-12 | Discharge: 2019-09-12 | Disposition: A | Discharge status: Home / Self Care | Payer: Federal, State, Local not specified - PPO | Source: Ambulatory Visit | Attending: Surgery | Admitting: Surgery

## 2019-09-12 ENCOUNTER — Other Ambulatory Visit: Payer: Self-pay | Admitting: Surgery

## 2019-09-12 ENCOUNTER — Other Ambulatory Visit: Payer: Self-pay

## 2019-09-12 DIAGNOSIS — Z01818 Encounter for other preprocedural examination: Secondary | ICD-10-CM | POA: Diagnosis present

## 2019-09-12 NOTE — Patient Instructions (Signed)
Your procedure is scheduled on: TO BE DETERMINED.  Report to DAY SURGERY DEPARTMENT LOCATED ON 2ND FLOOR MEDICAL MALL ENTRANCE. To find out your arrival time please call [phone] between 1PM - 3PM on [place].   Remember: Instructions that are not followed completely may result in serious medical risk, up to and including death, or upon the discretion of your surgeon and anesthesiologist your surgery may need to be rescheduled.      _X__ 1. Do not eat food after midnight the night before your procedure.                 No gum chewing or hard candies. You may drink clear liquids up to 2 hours                 before you are scheduled to arrive for your surgery- DO NOT drink clear                 liquids within 2 hours of the start of your surgery.                 Clear Liquids include:  water, apple juice without pulp, clear carbohydrate                 drink such as Clearfast or Gatorade, Black Coffee or Tea (Do not add                 anything to coffee or tea).    __X__2.  On the morning of surgery brush your teeth with toothpaste and water, you may rinse your mouth with mouthwash if you wish.  Do not swallow any toothpaste or mouthwash.       _X__ 3.  No Alcohol for 24 hours before or after surgery.     __X__ 4.  Notify your doctor if there is any change in your medical condition      (cold, fever, infections).      Do not wear jewelry, make-up, hairpins, clips or nail polish. Do not wear lotions, powders, or perfumes.  Do not shave 48 hours prior to surgery. Men may shave face and neck. Do not bring valuables to the hospital.     [facility] is not responsible for any belongings or valuables.    Contacts, dentures/partials or body piercings may not be worn into surgery. Bring a case for your contacts, glasses or hearing aids, a denture cup will be supplied.     Patients discharged the day of surgery will not be allowed to drive  home.     __X__ Take these medicines the morning of surgery with A SIP OF WATER:     1. NONE       __X__ Use CHG Soap as directed   _ X___ Use inhalers on the day of surgery. Also bring the inhaler with you to the hospital on the morning of surgery.   __X__ Stop Blood Thinners: Plavix & Aspirin 5 days prior to your procedure.   __X__ Stop Anti-inflammatories 7 days before surgery such as Advil, Ibuprofen, Motrin, BC or Goodies Powder, Naprosyn, Naproxen, Aleve, Aspirin, Meloxicam. May take Tylenol if needed for pain or discomfort.    __X__ Don't start taking any new herbal supplements before your surgery.

## 2019-09-14 ENCOUNTER — Encounter
Admission: RE | Admit: 2019-09-14 | Discharge: 2019-09-14 | Disposition: A | Discharge status: Home / Self Care | Payer: Federal, State, Local not specified - PPO | Source: Ambulatory Visit | Attending: Surgery | Admitting: Surgery

## 2019-09-14 ENCOUNTER — Other Ambulatory Visit: Payer: Self-pay

## 2019-09-14 DIAGNOSIS — I10 Essential (primary) hypertension: Secondary | ICD-10-CM

## 2019-09-14 DIAGNOSIS — Z01818 Encounter for other preprocedural examination: Secondary | ICD-10-CM | POA: Diagnosis present

## 2019-09-14 LAB — CBC
HCT: 47.6 % (ref 39.0–52.0)
Hemoglobin: 16.1 g/dL (ref 13.0–17.0)
MCH: 28.8 pg (ref 26.0–34.0)
MCHC: 33.8 g/dL (ref 30.0–36.0)
MCV: 85 fL (ref 80.0–100.0)
Platelets: 322 10*3/uL (ref 150–400)
RBC: 5.6 MIL/uL (ref 4.22–5.81)
RDW: 12.4 % (ref 11.5–15.5)
WBC: 7.9 10*3/uL (ref 4.0–10.5)
nRBC: 0 % (ref 0.0–0.2)

## 2019-09-14 LAB — BASIC METABOLIC PANEL
Anion gap: 5 (ref 5–15)
BUN: 16 mg/dL (ref 6–20)
CO2: 29 mmol/L (ref 22–32)
Calcium: 8.9 mg/dL (ref 8.9–10.3)
Chloride: 104 mmol/L (ref 98–111)
Creatinine, Ser: 1.29 mg/dL — ABNORMAL HIGH (ref 0.61–1.24)
GFR calc Af Amer: >60 mL/min (ref >60)
GFR calc non Af Amer: >60 mL/min (ref >60)
Glucose, Bld: 116 mg/dL — ABNORMAL HIGH (ref 70–99)
Potassium: 4 mmol/L (ref 3.5–5.1)
Sodium: 138 mmol/L (ref 135–145)

## 2019-09-18 ENCOUNTER — Other Ambulatory Visit: Admission: RE | Admit: 2019-09-18 | Payer: Federal, State, Local not specified - PPO | Source: Ambulatory Visit

## 2019-09-19 ENCOUNTER — Encounter: Payer: Self-pay | Admitting: Anesthesiology

## 2019-09-19 ENCOUNTER — Other Ambulatory Visit: Payer: Self-pay

## 2019-09-19 ENCOUNTER — Other Ambulatory Visit
Admission: RE | Admit: 2019-09-19 | Discharge: 2019-09-19 | Disposition: A | Discharge status: Home / Self Care | Payer: Federal, State, Local not specified - PPO | Source: Ambulatory Visit | Attending: Surgery | Admitting: Surgery

## 2019-09-19 DIAGNOSIS — U071 COVID-19: Secondary | ICD-10-CM | POA: Insufficient documentation

## 2019-09-19 DIAGNOSIS — Z01812 Encounter for preprocedural laboratory examination: Secondary | ICD-10-CM | POA: Diagnosis present

## 2019-09-19 LAB — SARS CORONAVIRUS 2 (TAT 6-24 HRS): SARS Coronavirus 2: POSITIVE — AB

## 2019-09-20 ENCOUNTER — Encounter: Admission: RE | Payer: Self-pay | Source: Ambulatory Visit

## 2019-09-20 ENCOUNTER — Ambulatory Visit
Admission: RE | Admit: 2019-09-20 | Payer: Federal, State, Local not specified - PPO | Source: Ambulatory Visit | Admitting: Surgery

## 2019-09-20 ENCOUNTER — Telehealth: Payer: Self-pay | Admitting: Physician Assistant

## 2019-09-20 SURGERY — REPAIR, TENDON, BICEPS, DISTAL
Anesthesia: Choice | Laterality: Left

## 2019-09-20 NOTE — Telephone Encounter (Signed)
   Called to discuss with [place] about Covid symptoms and the use of bamlanivimab, a monoclonal antibody infusion for those with mild to moderate Covid symptoms and at a high risk of hospitalization.    Pt does not qualify for infusion therapy as he has asymptomatic infection. Isolation precautions discussed. Advised to contact back for consideration should he develop symptoms. Patient verbalized understanding.     Hx of CAD and HTN  Saanya Zieske, PA - C

## 2019-09-29 ENCOUNTER — Other Ambulatory Visit: Payer: Self-pay

## 2019-09-29 ENCOUNTER — Other Ambulatory Visit
Admission: RE | Admit: 2019-09-29 | Discharge: 2019-09-29 | Disposition: A | Discharge status: Home / Self Care | Payer: Federal, State, Local not specified - PPO | Source: Ambulatory Visit | Attending: Surgery | Admitting: Surgery

## 2019-09-29 DIAGNOSIS — Z01812 Encounter for preprocedural laboratory examination: Secondary | ICD-10-CM | POA: Diagnosis not present

## 2019-09-29 DIAGNOSIS — Z20822 Contact with and (suspected) exposure to covid-19: Secondary | ICD-10-CM | POA: Insufficient documentation

## 2019-09-29 LAB — SARS CORONAVIRUS 2 (TAT 6-24 HRS): SARS Coronavirus 2: NEGATIVE

## 2019-10-03 ENCOUNTER — Ambulatory Visit: Payer: Federal, State, Local not specified - PPO | Admitting: Anesthesiology

## 2019-10-03 ENCOUNTER — Other Ambulatory Visit: Payer: Self-pay | Admitting: Surgery

## 2019-10-03 ENCOUNTER — Encounter
Admission: RE | Disposition: A | Discharge status: Home / Self Care | Payer: Self-pay | Source: Ambulatory Visit | Attending: Surgery

## 2019-10-03 ENCOUNTER — Other Ambulatory Visit: Payer: Self-pay

## 2019-10-03 ENCOUNTER — Encounter: Payer: Self-pay | Admitting: Surgery

## 2019-10-03 ENCOUNTER — Ambulatory Visit
Admission: RE | Admit: 2019-10-03 | Discharge: 2019-10-03 | Disposition: A | Discharge status: Home / Self Care | Payer: Federal, State, Local not specified - PPO | Source: Ambulatory Visit | Attending: Surgery | Admitting: Surgery

## 2019-10-03 ENCOUNTER — Ambulatory Visit: Payer: Federal, State, Local not specified - PPO

## 2019-10-03 DIAGNOSIS — Z820 Family history of epilepsy and other diseases of the nervous system: Secondary | ICD-10-CM | POA: Insufficient documentation

## 2019-10-03 DIAGNOSIS — I10 Essential (primary) hypertension: Secondary | ICD-10-CM | POA: Diagnosis not present

## 2019-10-03 DIAGNOSIS — Z8249 Family history of ischemic heart disease and other diseases of the circulatory system: Secondary | ICD-10-CM | POA: Insufficient documentation

## 2019-10-03 DIAGNOSIS — Z823 Family history of stroke: Secondary | ICD-10-CM | POA: Insufficient documentation

## 2019-10-03 DIAGNOSIS — Z79899 Other long term (current) drug therapy: Secondary | ICD-10-CM | POA: Diagnosis not present

## 2019-10-03 DIAGNOSIS — Y9389 Activity, other specified: Secondary | ICD-10-CM | POA: Insufficient documentation

## 2019-10-03 DIAGNOSIS — Z7982 Long term (current) use of aspirin: Secondary | ICD-10-CM | POA: Insufficient documentation

## 2019-10-03 DIAGNOSIS — S46212A Strain of muscle, fascia and tendon of other parts of biceps, left arm, initial encounter: Secondary | ICD-10-CM | POA: Diagnosis present

## 2019-10-03 DIAGNOSIS — I69312 Visuospatial deficit and spatial neglect following cerebral infarction: Secondary | ICD-10-CM | POA: Diagnosis not present

## 2019-10-03 DIAGNOSIS — Z85828 Personal history of other malignant neoplasm of skin: Secondary | ICD-10-CM | POA: Insufficient documentation

## 2019-10-03 DIAGNOSIS — Z800 Family history of malignant neoplasm of digestive organs: Secondary | ICD-10-CM | POA: Insufficient documentation

## 2019-10-03 DIAGNOSIS — K219 Gastro-esophageal reflux disease without esophagitis: Secondary | ICD-10-CM | POA: Insufficient documentation

## 2019-10-03 DIAGNOSIS — Z888 Allergy status to other drugs, medicaments and biological substances status: Secondary | ICD-10-CM | POA: Diagnosis not present

## 2019-10-03 DIAGNOSIS — M79602 Pain in left arm: Secondary | ICD-10-CM

## 2019-10-03 DIAGNOSIS — E785 Hyperlipidemia, unspecified: Secondary | ICD-10-CM | POA: Diagnosis not present

## 2019-10-03 DIAGNOSIS — X503XXA Overexertion from repetitive movements, initial encounter: Secondary | ICD-10-CM | POA: Diagnosis not present

## 2019-10-03 DIAGNOSIS — Z885 Allergy status to narcotic agent status: Secondary | ICD-10-CM | POA: Insufficient documentation

## 2019-10-03 DIAGNOSIS — Z7902 Long term (current) use of antithrombotics/antiplatelets: Secondary | ICD-10-CM | POA: Diagnosis not present

## 2019-10-03 DIAGNOSIS — Z833 Family history of diabetes mellitus: Secondary | ICD-10-CM | POA: Diagnosis not present

## 2019-10-03 DIAGNOSIS — M79603 Pain in arm, unspecified: Secondary | ICD-10-CM

## 2019-10-03 HISTORY — PX: DISTAL BICEPS TENDON REPAIR: SHX1461

## 2019-10-03 SURGERY — REPAIR, TENDON, BICEPS, DISTAL
Anesthesia: General | Site: Arm Lower | Laterality: Left

## 2019-10-03 MED ORDER — FAMOTIDINE 20 MG PO TABS: 20.0000 mg | ORAL_TABLET | Freq: Once | ORAL | Status: AC

## 2019-10-03 MED ORDER — CEFAZOLIN SODIUM-DEXTROSE 2-4 GM/100ML-% IV SOLN
2.0000 g | INTRAVENOUS | Status: AC
  Administered 2019-10-03: 2 g via INTRAVENOUS

## 2019-10-03 MED ORDER — LACTATED RINGERS IV SOLN: INTRAVENOUS | Status: DC

## 2019-10-03 MED ORDER — FENTANYL CITRATE (PF) 100 MCG/2ML IJ SOLN
INTRAMUSCULAR | Status: AC
  Filled 2019-10-03: qty 2

## 2019-10-03 MED ORDER — ONDANSETRON HCL 4 MG/2ML IJ SOLN
INTRAMUSCULAR | Status: AC
  Filled 2019-10-03: qty 2

## 2019-10-03 MED ORDER — ONDANSETRON HCL 4 MG/2ML IJ SOLN: 4.0000 mg | Freq: Four times a day (QID) | INTRAMUSCULAR | Status: DC | PRN

## 2019-10-03 MED ORDER — PROPOFOL 10 MG/ML IV BOLUS
INTRAVENOUS | Status: AC
  Filled 2019-10-03: qty 40

## 2019-10-03 MED ORDER — CHLORHEXIDINE GLUCONATE 4 % EX LIQD: 60.0000 mL | Freq: Once | CUTANEOUS | Status: DC

## 2019-10-03 MED ORDER — BUPIVACAINE-EPINEPHRINE (PF) 0.5% -1:200000 IJ SOLN
INTRAMUSCULAR | Status: AC
  Filled 2019-10-03: qty 30

## 2019-10-03 MED ORDER — FENTANYL CITRATE (PF) 100 MCG/2ML IJ SOLN
INTRAMUSCULAR | Status: DC | PRN
  Administered 2019-10-03 (×2): 50 ug via INTRAVENOUS
  Administered 2019-10-03 (×2): 25 ug via INTRAVENOUS
  Administered 2019-10-03: 50 ug via INTRAVENOUS

## 2019-10-03 MED ORDER — ONDANSETRON 4 MG PO TBDP: 4.0000 mg | ORAL_TABLET | Freq: Three times a day (TID) | ORAL | 1 refills | Status: DC | PRN

## 2019-10-03 MED ORDER — ONDANSETRON HCL 4 MG PO TABS: 4.0000 mg | ORAL_TABLET | Freq: Four times a day (QID) | ORAL | Status: DC | PRN

## 2019-10-03 MED ORDER — ONDANSETRON HCL 4 MG/2ML IJ SOLN
INTRAMUSCULAR | Status: DC | PRN
  Administered 2019-10-03: 4 mg via INTRAVENOUS

## 2019-10-03 MED ORDER — HYDROCODONE-ACETAMINOPHEN 5-325 MG PO TABS
ORAL_TABLET | ORAL | Status: AC
  Administered 2019-10-03: 1
  Filled 2019-10-03: qty 1

## 2019-10-03 MED ORDER — MIDAZOLAM HCL 2 MG/2ML IJ SOLN
INTRAMUSCULAR | Status: DC | PRN
  Administered 2019-10-03: 2 mg via INTRAVENOUS

## 2019-10-03 MED ORDER — MIDAZOLAM HCL 2 MG/2ML IJ SOLN
INTRAMUSCULAR | Status: AC
  Filled 2019-10-03: qty 2

## 2019-10-03 MED ORDER — HYDROCODONE-ACETAMINOPHEN 5-325 MG PO TABS: 1.0000 | ORAL_TABLET | ORAL | Status: DC | PRN

## 2019-10-03 MED ORDER — GLYCOPYRROLATE 0.2 MG/ML IJ SOLN
INTRAMUSCULAR | Status: AC
  Filled 2019-10-03: qty 1

## 2019-10-03 MED ORDER — EPHEDRINE SULFATE 50 MG/ML IJ SOLN
INTRAMUSCULAR | Status: DC | PRN
  Administered 2019-10-03: 5 mg via INTRAVENOUS
  Administered 2019-10-03: 15 mg via INTRAVENOUS

## 2019-10-03 MED ORDER — ONDANSETRON HCL 4 MG/2ML IJ SOLN: 4.0000 mg | Freq: Once | INTRAMUSCULAR | Status: DC | PRN

## 2019-10-03 MED ORDER — FAMOTIDINE 20 MG PO TABS
ORAL_TABLET | ORAL | Status: AC
  Administered 2019-10-03: 20 mg via ORAL
  Filled 2019-10-03: qty 1

## 2019-10-03 MED ORDER — POTASSIUM CHLORIDE IN NACL 20-0.9 MEQ/L-% IV SOLN: INTRAVENOUS | Status: DC

## 2019-10-03 MED ORDER — METOCLOPRAMIDE HCL 10 MG PO TABS: 5.0000 mg | ORAL_TABLET | Freq: Three times a day (TID) | ORAL | Status: DC | PRN

## 2019-10-03 MED ORDER — HYDROCODONE-ACETAMINOPHEN 5-325 MG PO TABS: 1.0000 | ORAL_TABLET | Freq: Four times a day (QID) | ORAL | 0 refills | Status: DC | PRN

## 2019-10-03 MED ORDER — FENTANYL CITRATE (PF) 100 MCG/2ML IJ SOLN
25.0000 ug | INTRAMUSCULAR | Status: DC | PRN
  Administered 2019-10-03: 25 ug via INTRAVENOUS

## 2019-10-03 MED ORDER — GLYCOPYRROLATE 0.2 MG/ML IJ SOLN
INTRAMUSCULAR | Status: DC | PRN
  Administered 2019-10-03: .2 mg via INTRAVENOUS

## 2019-10-03 MED ORDER — CEFAZOLIN SODIUM-DEXTROSE 2-4 GM/100ML-% IV SOLN
INTRAVENOUS | Status: AC
  Filled 2019-10-03: qty 100

## 2019-10-03 MED ORDER — DEXMEDETOMIDINE HCL IN NACL 200 MCG/50ML IV SOLN
INTRAVENOUS | Status: AC
  Filled 2019-10-03: qty 50

## 2019-10-03 MED ORDER — METOCLOPRAMIDE HCL 5 MG/ML IJ SOLN: 5.0000 mg | Freq: Three times a day (TID) | INTRAMUSCULAR | Status: DC | PRN

## 2019-10-03 MED ORDER — PROPOFOL 10 MG/ML IV BOLUS
INTRAVENOUS | Status: DC | PRN
  Administered 2019-10-03: 200 mg via INTRAVENOUS

## 2019-10-03 MED ORDER — DEXMEDETOMIDINE HCL IN NACL 200 MCG/50ML IV SOLN
INTRAVENOUS | Status: DC | PRN
  Administered 2019-10-03 (×2): 4 ug via INTRAVENOUS

## 2019-10-03 SURGICAL SUPPLY — 44 items
BNDG COHESIVE 4X5 TAN STRL (GAUZE/BANDAGES/DRESSINGS) ×2 IMPLANT
BNDG ELASTIC 3X5.8 VLCR STR LF (GAUZE/BANDAGES/DRESSINGS) ×2 IMPLANT
BNDG ELASTIC 4X5.8 VLCR STR LF (GAUZE/BANDAGES/DRESSINGS) ×2 IMPLANT
BNDG ESMARK 4X12 TAN STRL LF (GAUZE/BANDAGES/DRESSINGS) ×2 IMPLANT
CANISTER SUCT 1200ML W/VALVE (MISCELLANEOUS) ×2 IMPLANT
CHLORAPREP W/TINT 26 (MISCELLANEOUS) ×4 IMPLANT
COVER WAND RF STERILE (DRAPES) ×2 IMPLANT
CUFF TOURN 24 STER (MISCELLANEOUS) IMPLANT
CUFF TOURN SGL QUICK 18 (TOURNIQUET CUFF) ×2 IMPLANT
CUFF TOURN SGL QUICK 18X4 (TOURNIQUET CUFF) ×2 IMPLANT
DEVICE TOGGLELOC W/ZIPLOOP (Orthopedic Implant) ×1 IMPLANT
DRAPE FLUOR MINI C-ARM 54X84 (DRAPES) ×2 IMPLANT
DRAPE SURG 17X11 SM STRL (DRAPES) ×2 IMPLANT
DRAPE U-SHAPE 47X51 STRL (DRAPES) ×2 IMPLANT
ELECT REM PT RETURN 9FT ADLT (ELECTROSURGICAL) ×2
ELECTRODE REM PT RTRN 9FT ADLT (ELECTROSURGICAL) ×1 IMPLANT
GAUZE SPONGE 4X4 12PLY STRL (GAUZE/BANDAGES/DRESSINGS) ×2 IMPLANT
GAUZE XEROFORM 1X8 LF (GAUZE/BANDAGES/DRESSINGS) ×2 IMPLANT
GLOVE BIO SURGEON STRL SZ8 (GLOVE) ×4 IMPLANT
GLOVE INDICATOR 8.0 STRL GRN (GLOVE) ×2 IMPLANT
GOWN STRL REUS W/ TWL LRG LVL3 (GOWN DISPOSABLE) ×2 IMPLANT
GOWN STRL REUS W/TWL LRG LVL3 (GOWN DISPOSABLE) ×2
KIT TURNOVER KIT A (KITS) ×2 IMPLANT
NEEDLE MAYO 6 CRC TAPER PT (NEEDLE) ×2 IMPLANT
NS IRRIG 500ML POUR BTL (IV SOLUTION) ×2 IMPLANT
PACK EXTREMITY ARMC (MISCELLANEOUS) ×2 IMPLANT
PAD CAST CTTN 4X4 STRL (SOFTGOODS) ×1 IMPLANT
PADDING CAST 3IN STRL (MISCELLANEOUS) ×1
PADDING CAST 4IN STRL (MISCELLANEOUS) ×3
PADDING CAST BLEND 3X4 STRL (MISCELLANEOUS) ×1 IMPLANT
PADDING CAST BLEND 4X4 STRL (MISCELLANEOUS) ×3 IMPLANT
PADDING CAST COTTON 4X4 STRL (SOFTGOODS) ×1
SLING ARM LRG DEEP (SOFTGOODS) ×2 IMPLANT
SPLINT CAST 1 STEP 4X30 (MISCELLANEOUS) ×2 IMPLANT
STOCKINETTE IMPERV 14X48 (MISCELLANEOUS) ×2 IMPLANT
STRIP CLOSURE SKIN 1/2X4 (GAUZE/BANDAGES/DRESSINGS) ×2 IMPLANT
SUT PROLENE 2 0 FS (SUTURE) ×2 IMPLANT
SUT VIC AB 2-0 SH 27 (SUTURE) ×1
SUT VIC AB 2-0 SH 27XBRD (SUTURE) ×1 IMPLANT
SUT VIC AB 3-0 SH 27 (SUTURE) ×1
SUT VIC AB 3-0 SH 27X BRD (SUTURE) ×1 IMPLANT
SWABSTK COMLB BENZOIN TINCTURE (MISCELLANEOUS) ×2 IMPLANT
TOGGLELOC W/ZIPLOOP (Orthopedic Implant) ×2 IMPLANT
TOWEL OR 17X26 4PK STRL BLUE (TOWEL DISPOSABLE) ×2 IMPLANT

## 2019-10-03 NOTE — H&P (Signed)
Paper H&P to be scanned into permanent record. H&P reviewed and patient re-examined. No changes. 

## 2019-10-03 NOTE — Anesthesia Postprocedure Evaluation (Signed)
Anesthesia Post Note  Patient: Alejandro Arias  Procedure(s) Performed: DISTAL BICEPS TENDON REPAIR (Left Arm Lower)  Patient location during evaluation: PACU Anesthesia Type: General Level of consciousness: awake and alert Pain management: pain level controlled Vital Signs Assessment: post-procedure vital signs reviewed and stable Respiratory status: spontaneous breathing and respiratory function stable Cardiovascular status: stable Anesthetic complications: no     Last Vitals:  Vitals:   10/03/19 1426 10/03/19 1432  BP: (!) 145/98 (!) 147/97  Pulse: 89 69  Resp: (!) 30 16  Temp: (!) 36.1 C (!) 36.2 C  SpO2: 96% 100%    Last Pain:  Vitals:   10/03/19 1434  TempSrc:   PainSc: 7                  Kadra Kohan K

## 2019-10-03 NOTE — Anesthesia Preprocedure Evaluation (Signed)
Anesthesia Evaluation  Patient identified by MRN, date of birth, ID band Patient awake    Reviewed: Allergy & Precautions, NPO status , Patient's Chart, lab work & pertinent test results, reviewed documented beta blocker date and time   History of Anesthesia Complications Negative for: history of anesthetic complications  Airway Mallampati: II       Dental   Pulmonary neg sleep apnea, neg COPD, Not current smoker,           Cardiovascular hypertension, Pt. on medications and Pt. on home beta blockers (-) Past MI and (-) CHF (-) dysrhythmias (-) Valvular Problems/Murmurs     Neuro/Psych neg Seizures CVA (right peripheral vision loss, OU, loss of color vision), Residual Symptoms    GI/Hepatic Neg liver ROS, GERD  ,  Endo/Other  neg diabetes  Renal/GU negative Renal ROS     Musculoskeletal   Abdominal   Peds  Hematology   Anesthesia Other Findings   Reproductive/Obstetrics                            Anesthesia Physical Anesthesia Plan  ASA: III  Anesthesia Plan: General   Post-op Pain Management:    Induction: Intravenous  PONV Risk Score and Plan: 2 and Ondansetron and Dexamethasone  Airway Management Planned: LMA  Additional Equipment:   Intra-op Plan:   Post-operative Plan:   Informed Consent: I have reviewed the patients History and Physical, chart, labs and discussed the procedure including the risks, benefits and alternatives for the proposed anesthesia with the patient or authorized representative who has indicated his/her understanding and acceptance.       Plan Discussed with:   Anesthesia Plan Comments:         Anesthesia Quick Evaluation

## 2019-10-03 NOTE — Discharge Instructions (Addendum)
  .  AMBULATORY SURGERY  DISCHARGE INSTRUCTIONS   1) The drugs that you were given will stay in your system until tomorrow so for the next 24 hours you should not:  A) Drive an automobile B) Make any legal decisions C) Drink any alcoholic beverage   2) You may resume regular meals tomorrow.  Today it is better to start with liquids and gradually work up to solid foods.  You may eat anything you prefer, but it is better to start with liquids, then soup and crackers, and gradually work up to solid foods.   3) Please notify your doctor immediately if you have any unusual bleeding, trouble breathing, redness and pain at the surgery site, drainage, fever, or pain not relieved by medication.    4) Additional Instructions:        Please contact your physician with any problems or Same Day Surgery at [phone], Monday through Friday 6 am to 4 pm, or [place] [facility] number at [phone].  Orthopedic discharge instructions: Keep splint dry and intact. Keep hand elevated above heart level. Apply ice to affected area frequently. Take ibuprofen 600-800 mg TID with meals for 7-10 days, then as necessary. Take pain medication as prescribed or ES Tylenol when needed.  Return for follow-up in 10-14 days or as scheduled.   Norco taken March 9 at 2:47pm. Next dose if needed March 9, 8:47pm

## 2019-10-03 NOTE — Anesthesia Procedure Notes (Signed)
Procedure Name: LMA Insertion Date/Time: 10/03/2019 11:37 AM Performed by: Gentry Fitz, CRNA Pre-anesthesia Checklist: Patient identified, Emergency Drugs available, Patient being monitored and Suction available Patient Re-evaluated:Patient Re-evaluated prior to induction Oxygen Delivery Method: Circle system utilized Preoxygenation: Pre-oxygenation with 100% oxygen Induction Type: IV induction Ventilation: Mask ventilation without difficulty LMA: LMA inserted LMA Size: 5.0 Number of attempts: 1 Dental Injury: Teeth and Oropharynx as per pre-operative assessment

## 2019-10-03 NOTE — Transfer of Care (Signed)
Immediate Anesthesia Transfer of Care Note  Patient: Alejandro Arias  Procedure(s) Performed: DISTAL BICEPS TENDON REPAIR (Left Arm Lower)  Patient Location: PACU  Anesthesia Type:General  Level of Consciousness: drowsy  Airway & Oxygen Therapy: Patient Spontanous Breathing and Patient connected to face mask oxygen  Post-op Assessment: Report given to RN and Post -op Vital signs reviewed and stable  Post vital signs: Reviewed and stable  Last Vitals:  Vitals Value Taken Time  BP 133/83 10/03/19 1340  Temp 36.1 C 10/03/19 1338  Pulse 70 10/03/19 1344  Resp 14 10/03/19 1344  SpO2 99 % 10/03/19 1344  Vitals shown include unvalidated device data.  Last Pain:  Vitals:   10/03/19 1338  TempSrc:   PainSc: Asleep         Complications: No apparent anesthesia complications

## 2019-10-03 NOTE — Op Note (Signed)
10/03/2019  1:41 PM  Patient:   Alejandro Arias  Pre-Op Diagnosis:   Distal biceps tendon rupture, left elbow.  Post-Op Diagnosis:   Same.  Procedure:   Primary repair of ruptured distal biceps tendon, left elbow.  Surgeon:   Pascal Lux, MD  Assistant:   None  Anesthesia:   General LMA  Findings:   As above.  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  He did not seek treatment immediately, but because of persistent symptoms, finally presented to orthopedics.  An MRI scan was obtained to assess the distal biceps.  This confirmed the presence of a near complete rupture of the distal biceps tendon of his left elbow.  The patient presents at this time for definitive management of his injury.  Procedure:   The patient was brought into the operating room and lain in the supine position. After adequate general laryngeal mask anesthesia, the left upper extremity and hand were prepped with DuraPrep solution before being draped sterilely. Preoperative antibiotics were administered.  A timeout was performed to verify the appropriate surgical site before the limb was exsanguinated with an Esmarch and the tourniquet inflated to 250 mmHg.   A curvilinear incision was made over the antecubital fossa. The incision was carried down through the subcutaneous cutaneous tissues with care taken to identify and protect the neurovascular structures in the area. Using blunt dissection, the distal biceps tendon was identified. The lacertus fibrosis was still intact, preventing proximal migration of the tendon, so it was readily identified. The distal portion of the tendon was  debrided of degenerative tissues before a #2 OrthoSorb suture was placed through the tendon in a Krakw type suture technique, incorporating the loop of the Biomet ToggleLoc device. Tension was applied to the suture to be sure that it would not slip later.  Attention was directed distally. The bicipital sheath was followed down to the proximal radius using blunt dissection. With care, the bicipital tuberosity was identified by palpation and then dissected free of adjacent soft tissue. A guidewire was placed and its position verified fluoroscopically in AP and lateral projections. The guidewire was overreamed with an 8 mm acorn reamer through the anterior cortex before the posterior cortex was overreamed with a 4.5 mm reamer. The passing sutures of the ToggleLoc anchor were passed through the eye of the Beath needle and the needle brought out posteriorly, pulling the anchor through the radius. It was "set" before the tendon was cinched into the socket with the elbow flexed to 90. The adequacy of anchor position was verified fluoroscopically in AP and lateral projections and found to be satisfactory. The anterior sutures of the ToggleLoc device were rewoven through the distal aspect of the tendon to reinforce the repair before being tied securely. The repair was deemed to be stable to within 30 of extension.  The wound was copiously irrigated with sterile saline solution before the subcutaneous tissues were closed with 2-0 Vicryl interrupted sutures. 3-0 Vicryl subcuticular sutures were used to close the skin before Benzoin and steri-strips were applied to the skin. A  sterile bulky dressing was applied to the elbow before the arm was placed into a posterior splint maintaining the elbow at approximately 90 of flexion and slight supination. The patient was then awakened and returned to the recovery room in satisfactory condition after tolerating the procedure well.

## 2020-03-07 IMAGING — DX RIGHT HAND - COMPLETE 3+ VIEW
3 series · 3 of 3 positions shown · non-contrast
Comparison: None.

CLINICAL DATA: Laceration right ring finger secondary to a trip and
fall today. Initial encounter.

EXAM:
RIGHT HAND - COMPLETE 3+ VIEW

[hand ap]
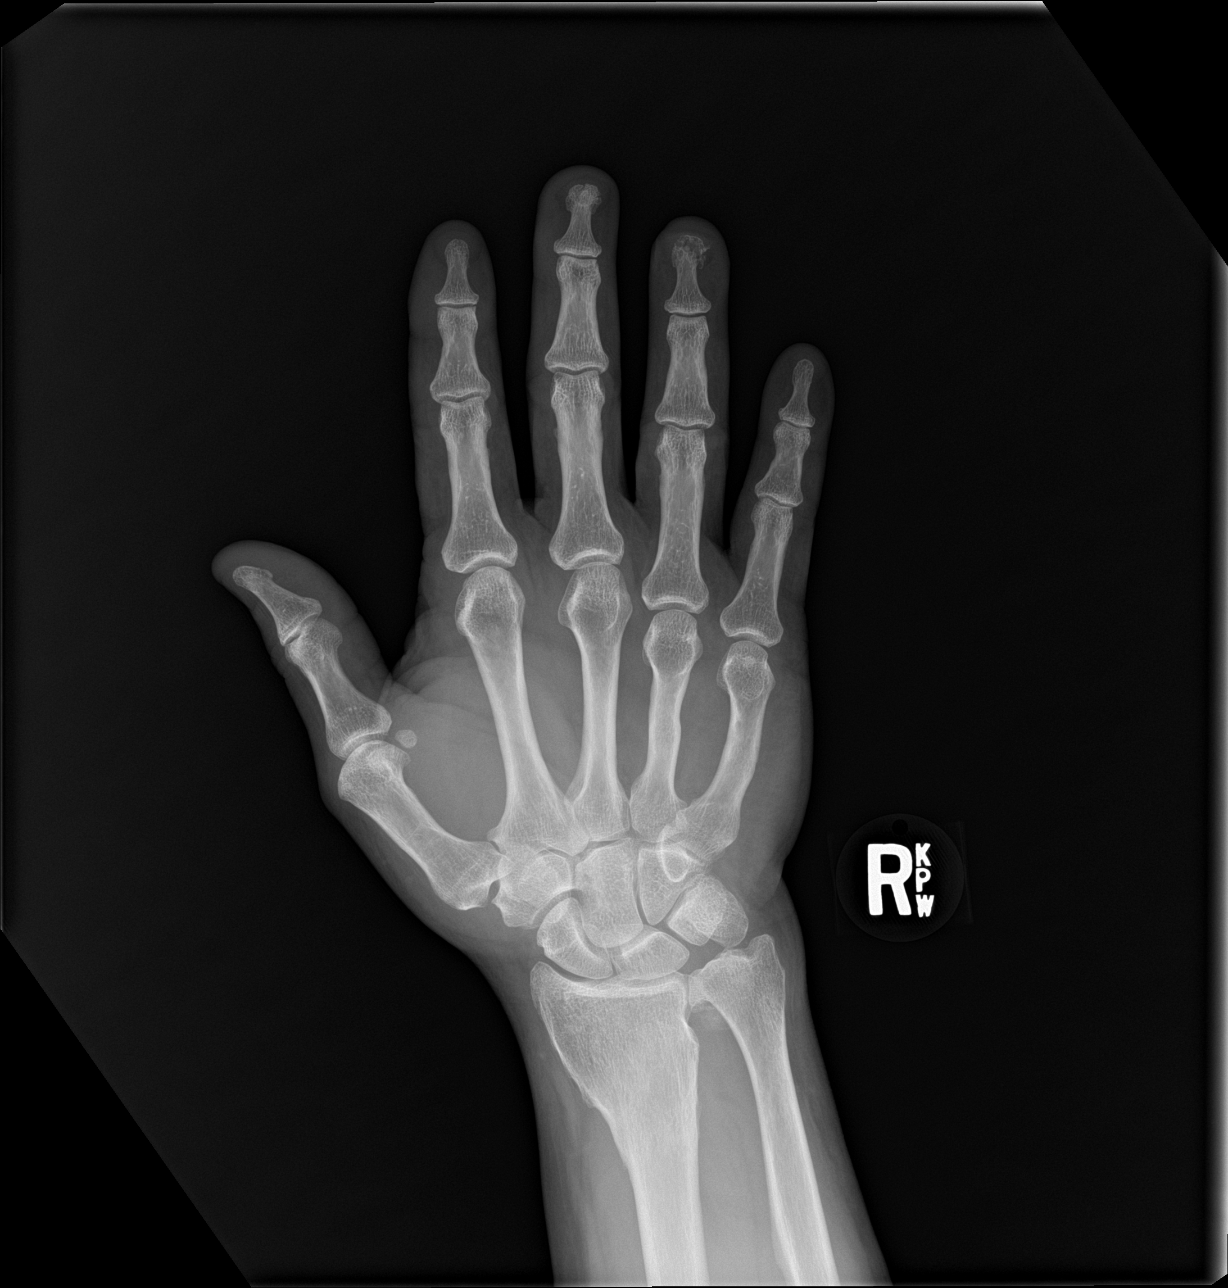

[hand obl]
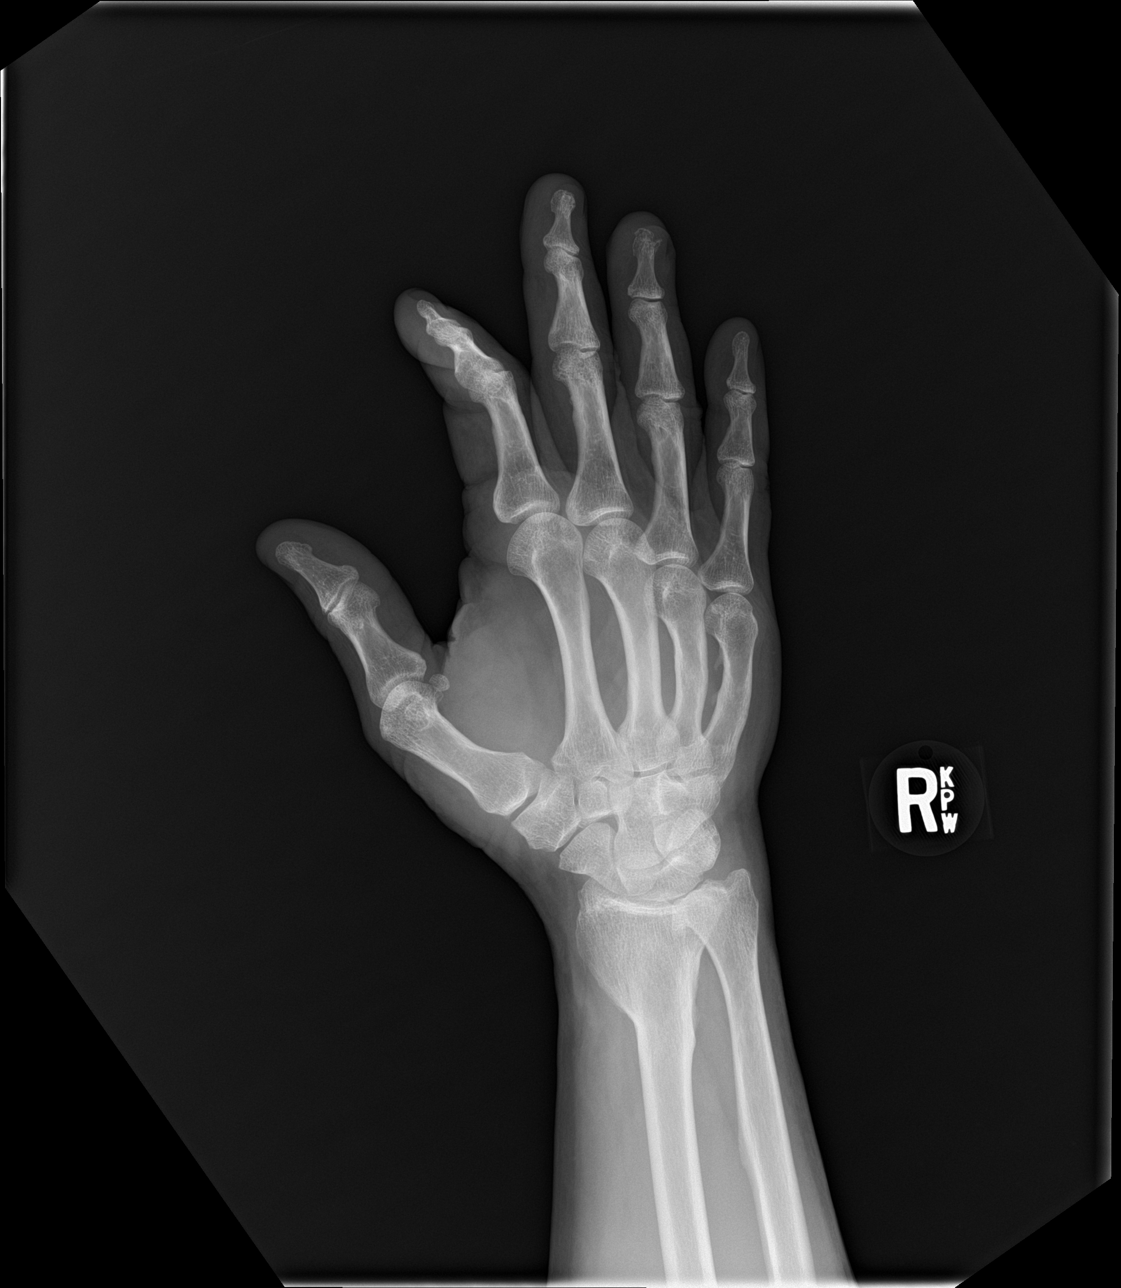

[hand lat]
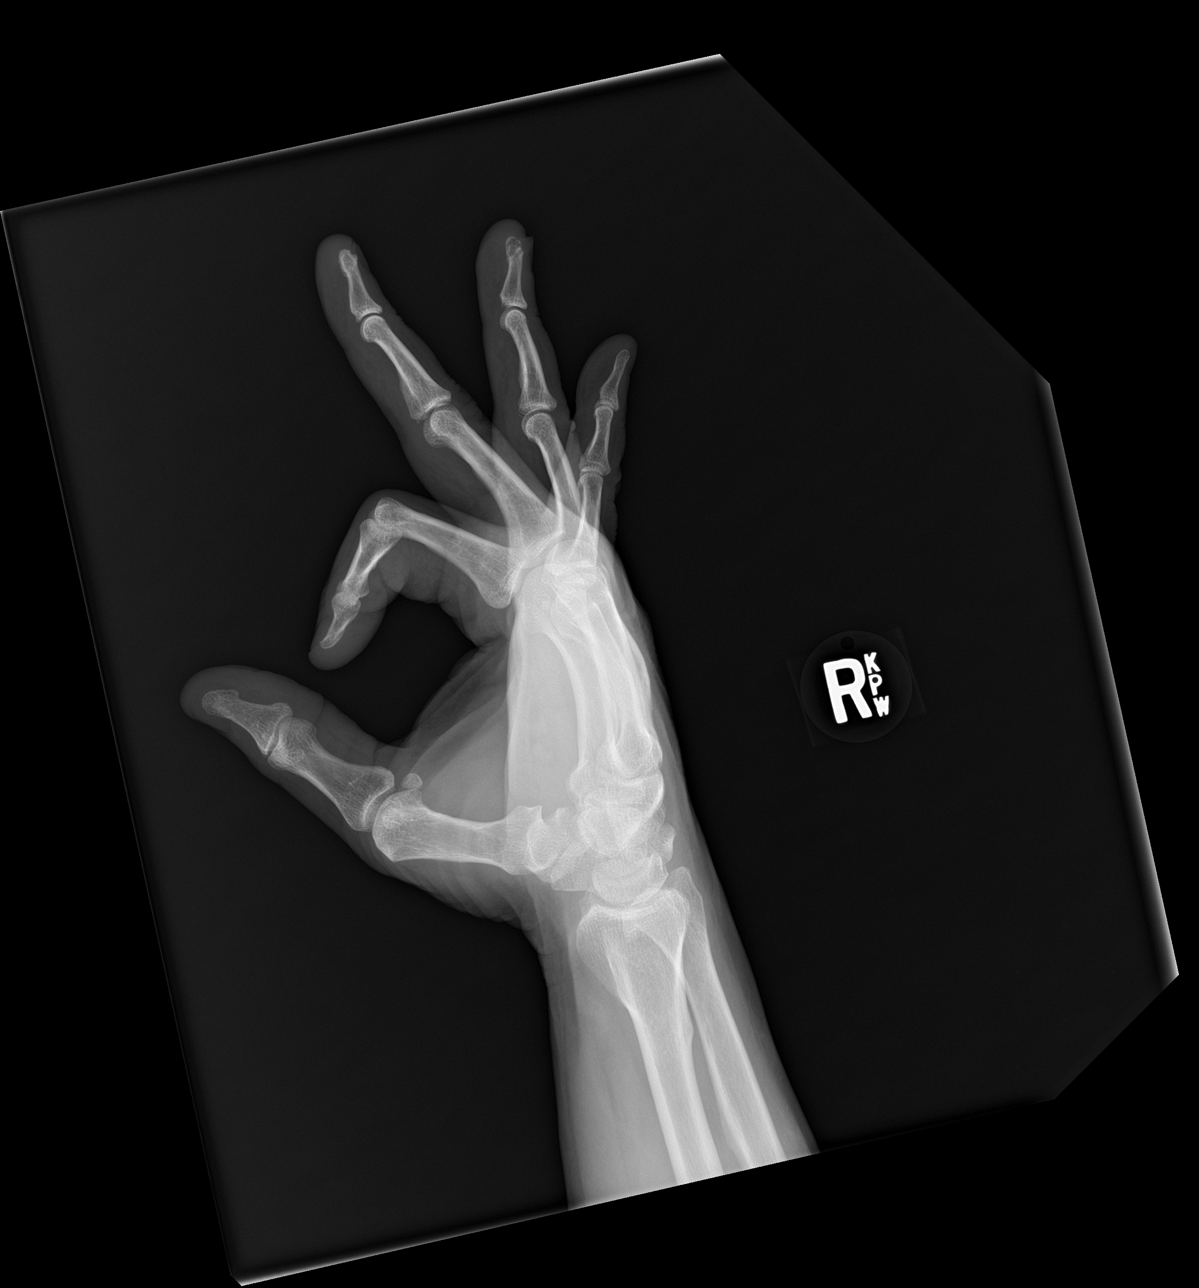

[3 of 3 positions shown; findings below may reference images not displayed]

FINDINGS: The patient has a fracture of the tuft of the right ring finger.
Associated soft tissue wound consistent with laceration noted. No
other acute bony abnormality. No foreign body.
IMPRESSION: Open tuft fracture right ring finger

## 2022-09-15 ENCOUNTER — Observation Stay
Admission: EM | Admit: 2022-09-15 | Discharge: 2022-09-16 | Disposition: A | Discharge status: Home / Self Care | Payer: Federal, State, Local not specified - PPO | Attending: Internal Medicine | Admitting: Internal Medicine

## 2022-09-15 ENCOUNTER — Encounter: Payer: Self-pay | Admitting: Emergency Medicine

## 2022-09-15 ENCOUNTER — Emergency Department: Payer: Federal, State, Local not specified - PPO

## 2022-09-15 ENCOUNTER — Other Ambulatory Visit: Payer: Self-pay

## 2022-09-15 DIAGNOSIS — R413 Other amnesia: Secondary | ICD-10-CM | POA: Insufficient documentation

## 2022-09-15 DIAGNOSIS — I2510 Atherosclerotic heart disease of native coronary artery without angina pectoris: Secondary | ICD-10-CM

## 2022-09-15 DIAGNOSIS — Z8673 Personal history of transient ischemic attack (TIA), and cerebral infarction without residual deficits: Secondary | ICD-10-CM | POA: Insufficient documentation

## 2022-09-15 DIAGNOSIS — Z955 Presence of coronary angioplasty implant and graft: Secondary | ICD-10-CM | POA: Diagnosis not present

## 2022-09-15 DIAGNOSIS — Z85828 Personal history of other malignant neoplasm of skin: Secondary | ICD-10-CM | POA: Insufficient documentation

## 2022-09-15 DIAGNOSIS — I10 Essential (primary) hypertension: Secondary | ICD-10-CM | POA: Insufficient documentation

## 2022-09-15 DIAGNOSIS — R29818 Other symptoms and signs involving the nervous system: Principal | ICD-10-CM

## 2022-09-15 DIAGNOSIS — J45909 Unspecified asthma, uncomplicated: Secondary | ICD-10-CM | POA: Insufficient documentation

## 2022-09-15 DIAGNOSIS — G4733 Obstructive sleep apnea (adult) (pediatric): Secondary | ICD-10-CM | POA: Insufficient documentation

## 2022-09-15 DIAGNOSIS — Z7982 Long term (current) use of aspirin: Secondary | ICD-10-CM | POA: Insufficient documentation

## 2022-09-15 DIAGNOSIS — J45998 Other asthma: Secondary | ICD-10-CM | POA: Diagnosis not present

## 2022-09-15 DIAGNOSIS — R42 Dizziness and giddiness: Secondary | ICD-10-CM

## 2022-09-15 DIAGNOSIS — R739 Hyperglycemia, unspecified: Secondary | ICD-10-CM

## 2022-09-15 DIAGNOSIS — Z7902 Long term (current) use of antithrombotics/antiplatelets: Secondary | ICD-10-CM | POA: Insufficient documentation

## 2022-09-15 DIAGNOSIS — I671 Cerebral aneurysm, nonruptured: Secondary | ICD-10-CM

## 2022-09-15 DIAGNOSIS — Z8679 Personal history of other diseases of the circulatory system: Secondary | ICD-10-CM

## 2022-09-15 DIAGNOSIS — Z79899 Other long term (current) drug therapy: Secondary | ICD-10-CM | POA: Diagnosis not present

## 2022-09-15 LAB — DIFFERENTIAL
Abs Immature Granulocytes: 0.02 10*3/uL (ref 0.00–0.07)
Basophils Absolute: 0.1 10*3/uL (ref 0.0–0.1)
Basophils Relative: 1 %
Eosinophils Absolute: 0.2 10*3/uL (ref 0.0–0.5)
Eosinophils Relative: 2 %
Immature Granulocytes: 0 %
Lymphocytes Relative: 21 %
Lymphs Abs: 2 10*3/uL (ref 0.7–4.0)
Monocytes Absolute: 0.6 10*3/uL (ref 0.1–1.0)
Monocytes Relative: 6 %
Neutro Abs: 6.5 10*3/uL (ref 1.7–7.7)
Neutrophils Relative %: 70 %

## 2022-09-15 LAB — CBC
HCT: 49.4 % (ref 39.0–52.0)
Hemoglobin: 16.9 g/dL (ref 13.0–17.0)
MCH: 28.4 pg (ref 26.0–34.0)
MCHC: 34.2 g/dL (ref 30.0–36.0)
MCV: 83 fL (ref 80.0–100.0)
Platelets: 366 10*3/uL (ref 150–400)
RBC: 5.95 MIL/uL — ABNORMAL HIGH (ref 4.22–5.81)
RDW: 12.2 % (ref 11.5–15.5)
WBC: 9.3 10*3/uL (ref 4.0–10.5)
nRBC: 0 % (ref 0.0–0.2)

## 2022-09-15 LAB — PROTIME-INR
INR: 1 (ref 0.8–1.2)
Prothrombin Time: 13.1 seconds (ref 11.4–15.2)

## 2022-09-15 LAB — COMPREHENSIVE METABOLIC PANEL
ALT: 28 U/L (ref 0–44)
AST: 23 U/L (ref 15–41)
Albumin: 4.2 g/dL (ref 3.5–5.0)
Alkaline Phosphatase: 64 U/L (ref 38–126)
Anion gap: 10 (ref 5–15)
BUN: 16 mg/dL (ref 6–20)
CO2: 24 mmol/L (ref 22–32)
Calcium: 8.9 mg/dL (ref 8.9–10.3)
Chloride: 100 mmol/L (ref 98–111)
Creatinine, Ser: 1.1 mg/dL (ref 0.61–1.24)
GFR, Estimated: >60 mL/min (ref >60)
Glucose, Bld: 224 mg/dL — ABNORMAL HIGH (ref 70–99)
Potassium: 3.3 mmol/L — ABNORMAL LOW (ref 3.5–5.1)
Sodium: 134 mmol/L — ABNORMAL LOW (ref 135–145)
Total Bilirubin: 0.6 mg/dL (ref 0.3–1.2)
Total Protein: 7.3 g/dL (ref 6.5–8.1)

## 2022-09-15 LAB — TROPONIN I (HIGH SENSITIVITY): Troponin I (High Sensitivity): 4 ng/L (ref <18)

## 2022-09-15 LAB — CBG MONITORING, ED: Glucose-Capillary: 240 mg/dL — ABNORMAL HIGH (ref 70–99)

## 2022-09-15 LAB — ETHANOL: Alcohol, Ethyl (B): <10 mg/dL (ref <10)

## 2022-09-15 LAB — APTT: aPTT: 25 seconds (ref 24–36)

## 2022-09-15 NOTE — ED Triage Notes (Addendum)
Pt arrived via POV with reports of lightheaded and dizziness spells about 9 today lasting about 20 seconds, pt states he starts having an altered sense of smell, pt reports hx of multiple strokes in the past. Pt states sxs began today.  BP has been elevated with systolics in 0000000 to [id number].  Pt took last dose of BP meds prior to arrival.  Pt reports last dizzy spell was around 2000.   Pt reports sxs began around 11am today.    No slurred speech noted. Pt has visual deficits from previous stroke.   Pt does report slight HA as well that started this morning.

## 2022-09-15 NOTE — ED Notes (Signed)
Patient transported to CT 

## 2022-09-16 ENCOUNTER — Observation Stay: Payer: Federal, State, Local not specified - PPO

## 2022-09-16 ENCOUNTER — Emergency Department: Payer: Federal, State, Local not specified - PPO

## 2022-09-16 ENCOUNTER — Observation Stay
Admit: 2022-09-16 | Discharge: 2022-09-16 | Disposition: A | Discharge status: Home / Self Care | Payer: Federal, State, Local not specified - PPO | Attending: Internal Medicine | Admitting: Internal Medicine

## 2022-09-16 ENCOUNTER — Encounter: Payer: Self-pay | Admitting: Internal Medicine

## 2022-09-16 DIAGNOSIS — R413 Other amnesia: Secondary | ICD-10-CM | POA: Insufficient documentation

## 2022-09-16 DIAGNOSIS — I10 Essential (primary) hypertension: Secondary | ICD-10-CM

## 2022-09-16 DIAGNOSIS — R29818 Other symptoms and signs involving the nervous system: Secondary | ICD-10-CM | POA: Diagnosis not present

## 2022-09-16 DIAGNOSIS — G4733 Obstructive sleep apnea (adult) (pediatric): Secondary | ICD-10-CM | POA: Insufficient documentation

## 2022-09-16 DIAGNOSIS — J45909 Unspecified asthma, uncomplicated: Secondary | ICD-10-CM

## 2022-09-16 DIAGNOSIS — Z8679 Personal history of other diseases of the circulatory system: Secondary | ICD-10-CM

## 2022-09-16 DIAGNOSIS — I2510 Atherosclerotic heart disease of native coronary artery without angina pectoris: Secondary | ICD-10-CM

## 2022-09-16 DIAGNOSIS — R739 Hyperglycemia, unspecified: Secondary | ICD-10-CM

## 2022-09-16 DIAGNOSIS — G459 Transient cerebral ischemic attack, unspecified: Secondary | ICD-10-CM | POA: Insufficient documentation

## 2022-09-16 DIAGNOSIS — I671 Cerebral aneurysm, nonruptured: Secondary | ICD-10-CM

## 2022-09-16 DIAGNOSIS — R42 Dizziness and giddiness: Secondary | ICD-10-CM

## 2022-09-16 HISTORY — DX: Unspecified asthma, uncomplicated: J45.909

## 2022-09-16 LAB — ECHOCARDIOGRAM COMPLETE
AR max vel: 2.99 cm2
AV Area VTI: 2.99 cm2
AV Area mean vel: 2.41 cm2
AV Mean grad: 3 mmHg
AV Peak grad: 4.8 mmHg
Ao pk vel: 1.09 m/s
Area-P 1/2: 2.51 cm2
Height: 71 in
S' Lateral: 3.1 cm
Weight: 3360 oz

## 2022-09-16 LAB — CBC
HCT: 50 % (ref 39.0–52.0)
Hemoglobin: 16.8 g/dL (ref 13.0–17.0)
MCH: 28.7 pg (ref 26.0–34.0)
MCHC: 33.6 g/dL (ref 30.0–36.0)
MCV: 85.3 fL (ref 80.0–100.0)
Platelets: 349 10*3/uL (ref 150–400)
RBC: 5.86 MIL/uL — ABNORMAL HIGH (ref 4.22–5.81)
RDW: 12.2 % (ref 11.5–15.5)
WBC: 10.3 10*3/uL (ref 4.0–10.5)
nRBC: 0 % (ref 0.0–0.2)

## 2022-09-16 LAB — LIPID PANEL
Cholesterol: 121 mg/dL (ref 0–200)
HDL: 44 mg/dL (ref >40)
LDL Cholesterol: 62 mg/dL (ref 0–99)
Total CHOL/HDL Ratio: 2.8 RATIO
Triglycerides: 73 mg/dL (ref <150)
VLDL: 15 mg/dL (ref 0–40)

## 2022-09-16 LAB — CREATININE, SERUM
Creatinine, Ser: 1.11 mg/dL (ref 0.61–1.24)
GFR, Estimated: >60 mL/min (ref >60)

## 2022-09-16 LAB — HEMOGLOBIN A1C
Hgb A1c MFr Bld: 5.4 % (ref 4.8–5.6)
Mean Plasma Glucose: 108.28 mg/dL

## 2022-09-16 LAB — CBG MONITORING, ED
Glucose-Capillary: 131 mg/dL — ABNORMAL HIGH (ref 70–99)
Glucose-Capillary: 114 mg/dL — ABNORMAL HIGH (ref 70–99)

## 2022-09-16 LAB — HIV ANTIBODY (ROUTINE TESTING W REFLEX): HIV Screen 4th Generation wRfx: NONREACTIVE

## 2022-09-16 LAB — TROPONIN I (HIGH SENSITIVITY): Troponin I (High Sensitivity): 3 ng/L (ref <18)

## 2022-09-16 MED ORDER — HYDROCHLOROTHIAZIDE 25 MG PO TABS
25.0000 mg | ORAL_TABLET | Freq: Every day | ORAL | Status: DC
  Administered 2022-09-16: 25 mg via ORAL
  Filled 2022-09-16: qty 1

## 2022-09-16 MED ORDER — ENOXAPARIN SODIUM 40 MG/0.4ML IJ SOSY
40.0000 mg | PREFILLED_SYRINGE | INTRAMUSCULAR | Status: DC
  Administered 2022-09-16: 40 mg via SUBCUTANEOUS
  Filled 2022-09-16: qty 0.4

## 2022-09-16 MED ORDER — ROSUVASTATIN CALCIUM 20 MG PO TABS: 40.0000 mg | ORAL_TABLET | Freq: Every day | ORAL | Status: DC

## 2022-09-16 MED ORDER — IOHEXOL 350 MG/ML SOLN
75.0000 mL | Freq: Once | INTRAVENOUS | Status: AC | PRN
  Administered 2022-09-16: 75 mL via INTRAVENOUS

## 2022-09-16 MED ORDER — ACETAMINOPHEN 325 MG PO TABS: 650.0000 mg | ORAL_TABLET | ORAL | Status: DC | PRN

## 2022-09-16 MED ORDER — HYDROCHLOROTHIAZIDE 12.5 MG PO TABS: 12.5000 mg | ORAL_TABLET | Freq: Every day | ORAL | Status: DC

## 2022-09-16 MED ORDER — INSULIN ASPART 100 UNIT/ML IJ SOLN
0.0000 [IU] | Freq: Three times a day (TID) | INTRAMUSCULAR | Status: DC
  Administered 2022-09-16: 1 [IU] via SUBCUTANEOUS
  Filled 2022-09-16: qty 1

## 2022-09-16 MED ORDER — LOSARTAN POTASSIUM 50 MG PO TABS
100.0000 mg | ORAL_TABLET | Freq: Every day | ORAL | Status: DC
  Administered 2022-09-16: 100 mg via ORAL
  Filled 2022-09-16: qty 2

## 2022-09-16 MED ORDER — LOSARTAN POTASSIUM-HCTZ 100-12.5 MG PO TABS: 1.0000 | ORAL_TABLET | Freq: Every day | ORAL | Status: DC

## 2022-09-16 MED ORDER — ASPIRIN 81 MG PO TBEC
81.0000 mg | DELAYED_RELEASE_TABLET | Freq: Every day | ORAL | Status: DC
  Administered 2022-09-16: 81 mg via ORAL
  Filled 2022-09-16: qty 1

## 2022-09-16 MED ORDER — ACETAMINOPHEN 160 MG/5ML PO SOLN: 650.0000 mg | ORAL | Status: DC | PRN

## 2022-09-16 MED ORDER — METOPROLOL SUCCINATE ER 50 MG PO TB24: 50.0000 mg | ORAL_TABLET | Freq: Every day | ORAL | Status: DC

## 2022-09-16 MED ORDER — STROKE: EARLY STAGES OF RECOVERY BOOK: Freq: Once | Status: DC

## 2022-09-16 MED ORDER — INSULIN ASPART 100 UNIT/ML IJ SOLN: 0.0000 [IU] | Freq: Every day | INTRAMUSCULAR | Status: DC

## 2022-09-16 MED ORDER — AMLODIPINE BESYLATE 5 MG PO TABS
5.0000 mg | ORAL_TABLET | Freq: Two times a day (BID) | ORAL | Status: DC
  Administered 2022-09-16: 5 mg via ORAL
  Filled 2022-09-16: qty 1

## 2022-09-16 MED ORDER — ACETAMINOPHEN 325 MG RE SUPP: 650.0000 mg | RECTAL | Status: DC | PRN

## 2022-09-16 NOTE — Assessment & Plan Note (Signed)
Albuterol as needed 

## 2022-09-16 NOTE — Assessment & Plan Note (Signed)
Continue BP control with metoprolol, Hyzaar, amlodipine

## 2022-09-16 NOTE — Assessment & Plan Note (Addendum)
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

## 2022-09-16 NOTE — Assessment & Plan Note (Signed)
Follows with cardiology Continue aspirin, rosuvastatin, ezetimibe, metoprolol

## 2022-09-16 NOTE — Assessment & Plan Note (Signed)
No history of diabetes Hemoglobin A1c Sliding scale insulin coverage

## 2022-09-16 NOTE — Progress Notes (Signed)
OT Cancellation Note  Patient Details Name: Alejandro Arias MRN: [id number] DOB: September 07, 1960   Cancelled Treatment:    Reason Eval/Treat Not Completed: OT screened, no needs identified, will sign off. Per PT, pt is at baseline for all mobility and self care needs. OT to complete order at this time.   Darleen Crocker, MS, OTR/L , CBIS ascom [phone]  09/16/22, 12:16 PM

## 2022-09-16 NOTE — Assessment & Plan Note (Signed)
CPAP if desired

## 2022-09-16 NOTE — ED Notes (Signed)
Pt returned from MRI at this time

## 2022-09-16 NOTE — Hospital Course (Addendum)
Taken from H&P.  Alejandro Arias is a 63 y.o. 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 ED course and data review: BP 141/91 with otherwise normal vitals Labs significant for blood glucose of 240 without a history of diabetes and potassium of 3.3. EKG,shows NSR at 76 with nonspecific ST-T wave changes.   CT head shows no acute intracranial abnormality chronic: MRI brain IMPRESSION: 1. No acute intracranial abnormality. 2. Numerous foci of chronic microhemorrhage throughout both hemispheres, the brainstem and the cerebellum, in addition to larger lesions with peripheral siderosis. 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 3. Bilateral occipital lobe encephalomalacia, left-greater-than-right CTA head IMPRESSION: 1. No emergent large vessel occlusion or high-grade stenosis of the intracranial arteries. 2. 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 3. Additional intracranial findings as described on earlier MRI.   2/21: Vitals stable.  Lipid panel normal, A1c 5.4.  PT and OT with no follow-up. Echocardiogram with normal EF, grade 1 diastolic dysfunction and no other abnormalities noted. Patient is appear to be at baseline.  Risk modification was discussed.  If patient  continue to have this dizzy spells with changing position-will get benefit from seeing an ENT as an outpatient.  Patient will continue with current medications and need to have a close follow-up with his providers for further recommendations.

## 2022-09-16 NOTE — Progress Notes (Signed)
SLP Cancellation Note  Patient Details Name: Alejandro Arias MRN: [id number] DOB: 1961-06-07   Cancelled treatment:       Reason Eval/Treat Not Completed: SLP screened, no needs identified, will sign off. Consult received and appreciated. Conversed in room w/ pt and wife. Pt reported no difficulties in speech, expressive/receptive language, cognitive skills, nor swallowing. Pt appropriately answered a variety of questions (yes/no, open-ended) w/ no overt difficulties. Communicated using complete sentences and appropriate pragmatic skills. Reported he ate breakfast this morning w/ no difficulties swallowing. RN agreed pt had no apparent issues regarding speech, language, cognition, nor swallowing.  Pt encouraged to talk to RN/MD if any new needs arise during this admission and contact PCP if he notices any residual deficits/changes upon returning to ADLs. Pt/wife appreciative and agreed.  No acute skilled ST needs at this time. ST services will s/o. MD to reconsult if any new needs arise during admit.   Alejandro Arias Graduate Clinician [place], Speech Pathology   Alejandro Arias 09/16/2022, 9:41 AM

## 2022-09-16 NOTE — Progress Notes (Signed)
*  PRELIMINARY RESULTS* Echocardiogram 2D Echocardiogram has been performed.  Alejandro Arias 09/16/2022, 11:08 AM

## 2022-09-16 NOTE — H&P (Addendum)
History and Physical    Patient: Alejandro Arias [id number] DOB: 12/27/1960 DOA: 09/15/2022 DOS: the patient was seen and examined on 09/16/2022 PCP: Gladstone Lighter, MD  Patient coming from: Home  Chief Complaint:  Chief Complaint  Patient presents with   Dizziness   Headache    HPI: Alejandro Arias is a 62 y.o. 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  The episode started at 11 AM with the last one being at 2000.  He also has  associated headache.  He denies one-sided weakness numbness or tingling ED course and data review: BP 141/91 with otherwise normal vitals Labs significant for blood glucose of 240 without a history of diabetes and potassium of 3.3. EKG, personally viewed and interpreted shows NSR at 76 with nonspecific ST-T wave changes.   CT head shows no acute intracranial abnormality chronic: MRI brain IMPRESSION: 1. No acute intracranial abnormality. 2. Numerous foci of chronic microhemorrhage throughout both hemispheres, the brainstem and the cerebellum, in addition to larger lesions with peripheral siderosis. 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 3. Bilateral occipital lobe encephalomalacia, left-greater-than-right CTA head IMPRESSION: 1. No emergent large vessel  occlusion or high-grade stenosis of the intracranial arteries. 2. 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 3. Additional intracranial findings as described on earlier MRI.   Hospitalist consulted for admission.   Review of Systems: As mentioned in the history of present illness. All other systems reviewed and are negative.  Past Medical History:  Diagnosis Date   Allergic state    Aneurysm ([facility]    Brain   Asthma, chronic 09/16/2022   [place]    [place]   Cerebral aneurysm    Cerebral aneurysm    Cerebral vascular disease    Chicken pox    GERD (gastroesophageal reflux disease)    Hyperlipemia    Hyperlipidemia    Hypertension    Shingles    Short-term memory loss    Stroke ([facility]    Past Surgical History:  Procedure Laterality Date   APPENDECTOMY     CARDIAC CATHETERIZATION Left 07/16/2016   Procedure: Left Heart Cath and Coronary Angiography;  Surgeon: DYolonda Kida MD;  [facility];  Service: Cardiovascular;  Laterality: Left;   CARDIAC CATHETERIZATION N/A 07/16/2016   Procedure: Coronary Stent Intervention;  Surgeon: DYolonda Kida MD;  [place] [facility];  Service: Cardiovascular;  Laterality: N/A;   COLONOSCOPY     COLONOSCOPY WITH PROPOFOL N/A 10/21/2017   Procedure: COLONOSCOPY WITH PROPOFOL;  Surgeon: SLollie Sails MD;  [facility];  Service: Endoscopy;  Laterality: N/A;   COLONOSCOPY WITH PROPOFOL N/A 08/26/2018   Procedure: COLONOSCOPY WITH PROPOFOL;  Surgeon: SLollie Sails MD;  [facility];  Service: Endoscopy;  Laterality: N/A;   DISTAL BICEPS TENDON REPAIR Left 10/03/2019   Procedure: DISTAL BICEPS TENDON REPAIR;  Surgeon: PCorky Mull  MD;  Location: [facility];  Service: Orthopedics;  Laterality: Left;   TONSILLECTOMY     VASECTOMY     Social History:  reports that he has never smoked. He has never used smokeless tobacco. He reports current  alcohol use of about 2.0 standard drinks of alcohol per week. He reports that he does not use drugs.  Allergies  Allergen Reactions   Morphine Anaphylaxis and Nausea And Vomiting    Other reaction(s): Vomiting   Lipitor [Atorvastatin Calcium] Other (See Comments)    Aches and pains   Morphine And Related Nausea And Vomiting    Family History  Problem Relation Age of Onset   Colon cancer Mother    Diabetes Mother    Hypertension Mother    Heart attack Father    Seizures Father     Prior to Admission medications   Medication Sig Start Date End Date Taking? Authorizing Provider  amLODipine (NORVASC) 5 MG tablet Take 1 tablet by mouth 2 (two) times daily. 07/28/22  Yes [provider]  aspirin EC 81 MG tablet Take 81 mg by mouth daily.   Yes [provider]  clopidogrel (PLAVIX) 75 MG tablet Take 75 mg by mouth daily.   Yes [provider]  ezetimibe (ZETIA) 10 MG tablet Take 10 mg by mouth daily. 08/31/22 08/31/23 Yes [provider]  hydrochlorothiazide (HYDRODIURIL) 12.5 MG tablet Take 12.5 mg by mouth daily.   Yes [provider]  losartan-hydrochlorothiazide (HYZAAR) 100-12.5 MG tablet Take 1 tablet by mouth daily. 06/10/20  Yes [provider]  metoprolol succinate (TOPROL-XL) 25 MG 24 hr tablet Take 50 mg by mouth daily.    Yes [provider]  rosuvastatin (CRESTOR) 40 MG tablet Take 1 tablet (40 mg total) by mouth daily at 6 PM. Patient taking differently: Take 40 mg by mouth daily. 07/17/16 09/16/22 Yes Callwood, Dwayne D, MD  vitamin B-12 (CYANOCOBALAMIN) 1000 MCG tablet Take 1,000 mcg by mouth daily.   Yes [provider]  HYDROcodone-acetaminophen (NORCO) 5-325 MG tablet Take 1-2 tablets by mouth every 6 (six) hours as needed for moderate pain or severe pain. MAXIMUM TOTAL ACETAMINOPHEN DOSE IS 4000 MG PER DAY Patient not taking: Reported on 09/16/2022 10/03/19   Poggi, Marshall Cork, MD  ondansetron (ZOFRAN ODT) 4 MG  disintegrating tablet Take 1 tablet (4 mg total) by mouth every 8 (eight) hours as needed for nausea or vomiting. Patient not taking: Reported on 09/16/2022 10/03/19   Corky Mull, MD    Physical Exam: Vitals:   09/15/22 2110 09/16/22 0000 09/16/22 0100 09/16/22 0130  BP: (!) 141/91 126/87 135/87 (!) 139/96  Pulse: 76 63 61 64  Resp: 18 17 13 15  $ Temp: 98.2 F (36.8 C)     TempSrc: Oral     SpO2: 94% 93% 91% 93%  Weight:      Height:       Physical Exam Vitals and nursing note reviewed.  Constitutional:      General: He is not in acute distress. HENT:     Head: Normocephalic and atraumatic.  Cardiovascular:     Rate and Rhythm: Normal rate and regular rhythm.     Heart sounds: Normal heart sounds.  Pulmonary:     Effort: Pulmonary effort is normal.     Breath sounds: Normal breath sounds.  Abdominal:     Palpations: Abdomen is soft.     Tenderness: There is no abdominal tenderness.  Neurological:     General:  No focal deficit present.     Mental Status: He is alert. Mental status is at baseline.     Labs on Admission: I have personally reviewed following labs and imaging studies  CBC: Recent Labs  Lab 09/15/22 2110  WBC 9.3  NEUTROABS 6.5  HGB 16.9  HCT 49.4  MCV 83.0  PLT [id number]   Basic Metabolic Panel: Recent Labs  Lab 09/15/22 2110  NA 134*  K 3.3*  CL 100  CO2 24  GLUCOSE 224*  BUN 16  CREATININE 1.10  CALCIUM 8.9   GFR: Estimated Creatinine Clearance: 85.2 mL/min (by C-G formula based on SCr of 1.1 mg/dL). Liver Function Tests: Recent Labs  Lab 09/15/22 2110  AST 23  ALT 28  ALKPHOS 64  BILITOT 0.6  PROT 7.3  ALBUMIN 4.2   No results for input(s): "LIPASE", "AMYLASE" in the last 168 hours. No results for input(s): "AMMONIA" in the last 168 hours. Coagulation Profile: Recent Labs  Lab 09/15/22 2110  INR 1.0   Cardiac Enzymes: No results for input(s): "CKTOTAL", "CKMB", "CKMBINDEX", "TROPONINI" in the last 168 hours. BNP (last 3  results) No results for input(s): "PROBNP" in the last 8760 hours. HbA1C: No results for input(s): "HGBA1C" in the last 72 hours. CBG: Recent Labs  Lab 09/15/22 2122  GLUCAP 240*   Lipid Profile: No results for input(s): "CHOL", "HDL", "LDLCALC", "TRIG", "CHOLHDL", "LDLDIRECT" in the last 72 hours. Thyroid Function Tests: No results for input(s): "TSH", "T4TOTAL", "FREET4", "T3FREE", "THYROIDAB" in the last 72 hours. Anemia Panel: No results for input(s): "VITAMINB12", "FOLATE", "FERRITIN", "TIBC", "IRON", "RETICCTPCT" in the last 72 hours. Urine analysis: No results found for: "COLORURINE", "APPEARANCEUR", "LABSPEC", "PHURINE", "GLUCOSEU", "HGBUR", "BILIRUBINUR", "KETONESUR", "PROTEINUR", "UROBILINOGEN", "NITRITE", "LEUKOCYTESUR"  Radiological Exams on Admission: CT ANGIO HEAD NECK W WO CM  Result Date: 09/16/2022 CLINICAL DATA:  Headache and dizziness EXAM: CT ANGIOGRAPHY HEAD AND NECK TECHNIQUE: Multidetector CT imaging of the head and neck was performed using the standard protocol during bolus administration of intravenous contrast. Multiplanar CT image reconstructions and MIPs were obtained to evaluate the vascular anatomy. Carotid stenosis measurements (when applicable) are obtained utilizing NASCET criteria, using the distal internal carotid diameter as the denominator. RADIATION DOSE REDUCTION: This exam was performed according to the departmental dose-optimization program which includes automated exposure control, adjustment of the mA and/or kV according to patient size and/or use of iterative reconstruction technique. CONTRAST:  59m OMNIPAQUE IOHEXOL 350 MG/ML SOLN COMPARISON:  MRA head 08/08/2015 MRI brain 09/16/2022 FINDINGS: CTA NECK FINDINGS SKELETON: There is no bony spinal canal stenosis. No lytic or blastic lesion. OTHER NECK: Normal pharynx, larynx and major salivary glands. No cervical lymphadenopathy. Unremarkable thyroid gland. UPPER CHEST: No pneumothorax or pleural  effusion. No nodules or masses. AORTIC ARCH: There is no calcific atherosclerosis of the aortic arch. There is no aneurysm, dissection or hemodynamically significant stenosis of the visualized portion of the aorta. Conventional 3 vessel aortic branching pattern. The visualized proximal subclavian arteries are widely patent. RIGHT CAROTID SYSTEM: Normal without aneurysm, dissection or stenosis. LEFT CAROTID SYSTEM: Normal without aneurysm, dissection or stenosis. VERTEBRAL ARTERIES: Left dominant configuration. Both origins are clearly patent. There is no dissection, occlusion or flow-limiting stenosis to the skull base (V1-V3 segments). CTA HEAD FINDINGS POSTERIOR CIRCULATION: --Vertebral arteries: Normal V4 segments. --Inferior cerebellar arteries: Normal. --Basilar artery: Normal. --Superior cerebellar arteries: Normal. --Posterior cerebral arteries (PCA): Normal. ANTERIOR CIRCULATION: --Intracranial internal carotid arteries: Normal. --Anterior cerebral arteries (ACA): There is a 4 x 3 mm leftward  projecting anterior communicating artery aneurysm, which is unchanged. --Middle cerebral arteries (MCA): Normal. VENOUS SINUSES: As permitted by contrast timing, patent. ANATOMIC VARIANTS: None Review of the MIP images confirms the above findings. IMPRESSION: 1. No emergent large vessel occlusion or high-grade stenosis of the intracranial arteries. 2. 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 3. Additional intracranial findings as described on earlier MRI. Electronically Signed   By: Ulyses Jarred M.D.   On: 09/16/2022 03:00   MR BRAIN WO CONTRAST  Result Date: 09/16/2022 CLINICAL DATA:  Acute neurologic deficit EXAM: MRI HEAD WITHOUT CONTRAST TECHNIQUE: Multiplanar, multiecho pulse sequences of the brain and surrounding structures were obtained without intravenous contrast. COMPARISON:  08/08/2015 FINDINGS: Brain: There are numerous foci of chronic microhemorrhage throughout  both hemispheres, the brainstem and the cerebellum. Large cavernous malformation at the anterior left parietal lobe is unchanged. Cavernous malformation at the anterior right insula head is increased in size to 10 mm. 4 mm right frontal cavernous malformation is unchanged. There is bilateral occipital lobe encephalomalacia, left-greater-than-right. No acute infarct or acute hemorrhage. No midline shift or other mass effect. Vascular: Normal flow voids. Skull and upper cervical spine: Normal marrow signal. Sinuses/Orbits: Right maxillary sinus mucosal thickening Other: None IMPRESSION: 1. No acute intracranial abnormality. 2. Numerous foci of chronic microhemorrhage throughout both hemispheres, the brainstem and the cerebellum, in addition to larger lesions with peripheral siderosis. 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 3. Bilateral occipital lobe encephalomalacia, left-greater-than-right. Electronically Signed   By: Ulyses Jarred M.D.   On: 09/16/2022 02:31   CT HEAD WO CONTRAST  Result Date: 09/15/2022 CLINICAL DATA:  Dizziness EXAM: CT HEAD WITHOUT CONTRAST TECHNIQUE: Contiguous axial images were obtained from the base of the skull through the vertex without intravenous contrast. RADIATION DOSE REDUCTION: This exam was performed according to the departmental dose-optimization program which includes automated exposure control, adjustment of the mA and/or kV according to patient size and/or use of iterative reconstruction technique. COMPARISON:  08/26/2009 FINDINGS: Brain: There are multiple intraparenchymal hyperdensities consistent with cavernous malformations. The largest is located in the posterior left frontal lobe and measures 14 mm. There is bilateral posterior cerebral artery territory encephalomalacia. No acute hemorrhage or extra-axial collection. Vascular: Negative Skull: Normal Sinuses/Orbits:  Paranasal sinuses and mastoids are clear. Normal orbits. Other: None IMPRESSION: 1. No acute intracranial abnormality. 2. Multiple intraparenchymal hyperdensities consistent with cavernous malformations. 3. Bilateral posterior cerebral artery territory encephalomalacia. Electronically Signed   By: Ulyses Jarred M.D.   On: 09/15/2022 21:37     Data Reviewed: Relevant notes from primary care and specialist visits, past discharge summaries as available in EHR, including Care Everywhere. Prior diagnostic testing as pertinent to current admission diagnoses Updated medications and problem lists for reconciliation ED course, including vitals, labs, imaging, treatment and response to treatment Triage notes, nursing and pharmacy notes and ED provider's notes Notable results as noted in HPI   Assessment and Plan: * Transient neurologic deficit History of CVA secondary to vertebral arterial dissection in 2014 Brain aneurysm 3 to 4 mm , stable from 2021-2024 Cavernous malformations brain with chronic microhemorrhages on MRI 09/16/22 Residual vision loss and short-term memory loss from prior CVA Patient with multiple episodes of dizziness associated with altered sense of smell Patient is currently on aspirin Follow-up CTA head and neck on MRI--->non acute BP control due to aneurysm history Continuous cardiac monitoring, echo Continue  antiplatelet therapy pending neuro recommendations and  continue statin Neurology consult  CAD S/P percutaneous coronary angioplasty Follows with cardiology Continue aspirin, rosuvastatin, ezetimibe, metoprolol  Hyperglycemia No history of diabetes Hemoglobin A1c Sliding scale insulin coverage  Asthma, chronic Albuterol as needed  OSA (obstructive sleep apnea) CPAP if desired  Short-term memory loss Supportive care  Hypertension Continue BP control with metoprolol, Hyzaar, amlodipine    DVT prophylaxis: Lovenox  Consults: Dr Rory Percy,  neurology  Advance Care Planning:   Code Status: Prior   Family Communication: wife at bedside  Disposition Plan: Back to previous home environment  Severity of Illness: The appropriate patient status for this patient is OBSERVATION. Observation status is judged to be reasonable and necessary in order to provide the required intensity of service to ensure the patient's safety. The patient's presenting symptoms, physical exam findings, and initial radiographic and laboratory data in the context of their medical condition is felt to place them at decreased risk for further clinical deterioration. Furthermore, it is anticipated that the patient will be medically stable for discharge from the hospital within 2 midnights of admission.  CRITICAL CARE Performed by: Athena Masse   Total critical care time: 60 minutes  Critical care time was exclusive of separately billable procedures and treating other patients.  Critical care was necessary to treat or prevent imminent or life-threatening deterioration.  Critical care was time spent personally by me on the following activities: development of treatment plan with patient and/or surrogate as well as nursing, discussions with consultants, evaluation of patient's response to treatment, examination of patient, obtaining history from patient or surrogate, ordering and performing treatments and interventions, ordering and review of laboratory studies, ordering and review of radiographic studies, pulse oximetry and re-evaluation of patient's condition.  Author: Athena Masse, MD 09/16/2022 4:18 AM  For on call review [email or web address].

## 2022-09-16 NOTE — Assessment & Plan Note (Signed)
Supportive care. 

## 2022-09-16 NOTE — Discharge Summary (Signed)
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  Alejandro Arias a 62y.o. 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 ED course and data review: BP 141/91 with otherwise normal vitals Labs significant for blood glucose of 240 without a history of diabetes and potassium of 3.3. EKG,shows NSR at 76 with nonspecific ST-T wave changes.   CT head shows no acute intracranial abnormality chronic: MRI brain IMPRESSION: 1. No acute intracranial abnormality. 2. Numerous foci of chronic microhemorrhage throughout both hemispheres, the brainstem and the cerebellum, in addition to larger lesions with peripheral siderosis. 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 3. Bilateral occipital lobe encephalomalacia, left-greater-than-right CTA head IMPRESSION: 1. No emergent large vessel occlusion or high-grade stenosis of the intracranial arteries. 2. 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 3. Additional intracranial findings as described on earlier MRI.   2/21: Vitals stable.  Lipid panel normal, A1c 5.4.  PT and OT with no follow-up. Echocardiogram with normal EF, grade 1 diastolic dysfunction and no other abnormalities noted. Patient is appear to be at baseline.  Risk modification was discussed.  If patient continue to have this dizzy spells with changing position-will get benefit from seeing an ENT as an outpatient.  Patient will continue with current medications and need to have a close follow-up with his providers for further recommendations.    Assessment and Plan: * Transient neurologic deficit History of CVA secondary to vertebral arterial dissection in 2014 Brain aneurysm 3 to 4 mm , stable from 2021-2024 Cavernous malformations brain with chronic microhemorrhages on MRI 09/16/22 Residual vision loss and short-term memory loss from prior CVA Patient with multiple episodes of dizziness associated with altered sense of smell Patient is currently on aspirin Follow-up CTA head and neck on MRI--->non acute BP control due to aneurysm history Continuous cardiac monitoring, echo Continue  antiplatelet therapy pending neuro recommendations and continue statin Neurology consult  CAD S/P percutaneous coronary angioplasty Follows with cardiology Continue aspirin, rosuvastatin, ezetimibe, metoprolol  Hyperglycemia No history of diabetes Hemoglobin A1c Sliding scale insulin coverage  Asthma, chronic Albuterol as needed  OSA (obstructive sleep apnea) CPAP if desired  Short-term  memory loss Supportive care  Hypertension Continue BP control with  metoprolol, Hyzaar, amlodipine   Consultants:  Procedures performed: None Disposition: Home Diet recommendation:  Discharge Diet Orders (From admission, onward)     Start     Ordered   09/16/22 0000  Diet - low sodium heart healthy        09/16/22 1401           Cardiac diet DISCHARGE MEDICATION: Allergies as of 09/16/2022       Reactions   Morphine Anaphylaxis, Nausea And Vomiting   Other reaction(s): Vomiting   Lipitor [atorvastatin Calcium] Other (See Comments)   Aches and pains   Morphine And Related Nausea And Vomiting        Medication List     STOP taking these medications    hydrochlorothiazide 12.5 MG tablet Commonly known as: HYDRODIURIL   HYDROcodone-acetaminophen 5-325 MG tablet Commonly known as: Norco   ondansetron 4 MG disintegrating tablet Commonly known as: Zofran ODT       TAKE these medications    amLODipine 5 MG tablet Commonly known as: NORVASC Take 1 tablet by mouth 2 (two) times daily.   aspirin EC 81 MG tablet Take 81 mg by mouth daily.   clopidogrel 75 MG tablet Commonly known as: PLAVIX Take 75 mg by mouth daily.   cyanocobalamin 1000 MCG tablet Commonly known as: VITAMIN B12 Take 1,000 mcg by mouth daily.   ezetimibe 10 MG tablet Commonly known as: ZETIA Take 10 mg by mouth daily.   losartan-hydrochlorothiazide 100-12.5 MG tablet Commonly known as: HYZAAR Take 1 tablet by mouth daily.   metoprolol succinate 25 MG 24 hr tablet Commonly known as: TOPROL-XL Take 50 mg by mouth daily.   rosuvastatin 40 MG tablet Commonly known as: CRESTOR Take 1 tablet (40 mg total) by mouth daily at 6 PM. What changed: when to take this        Follow-up Information     Gladstone Lighter, MD. Schedule an appointment as soon as possible for a visit in 1 week(s).   Specialty: Internal Medicine Contact information: [address] [place]  [phone]                Discharge Exam: Danley Danker Weights   09/15/22 2108  Weight: 95.3 kg   General.  Well-developed gentleman, in no acute distress. Pulmonary.  Lungs clear bilaterally, normal respiratory effort. CV.  Regular rate and rhythm, no JVD, rub or murmur. Abdomen.  Soft, nontender, nondistended, BS positive. CNS.  Alert and oriented .  No focal neurologic deficit. Extremities.  No edema, no cyanosis, pulses intact and symmetrical. Psychiatry.  Judgment and insight appears normal.   Condition at discharge: stable  The results of significant diagnostics from this hospitalization (including imaging, microbiology, ancillary and laboratory) are listed below for reference.   Imaging Studies: ECHOCARDIOGRAM COMPLETE  Result Date: 09/16/2022    ECHOCARDIOGRAM REPORT   Patient Name:   GRADON GERATY Date of Exam: 09/16/2022 Medical Rec #:  [id number]      Height:       71.0 in Accession #:    [id number]     Weight:       210.0 lb Date of Birth:  May 21, 1961     BSA:          2.153 m Patient Age:    10 years       BP:           121/76 mmHg Patient Gender: M  HR:           60 bpm. Exam Location:  [facility] Procedure: 2D Echo, Cardiac Doppler and Color Doppler Indications:     Stroke I63.9  History:         Patient has no prior history of Echocardiogram examinations.                  Stroke; Risk Factors:Hypertension and Dyslipidemia.  Sonographer:     Sherrie Sport Referring Phys:  [id number] Athena Masse Diagnosing Phys: Isaias Cowman MD IMPRESSIONS  1. Left ventricular ejection fraction, by estimation, is 55 to 60%. The left ventricle has normal function. The left ventricle has no regional wall motion abnormalities. Left ventricular diastolic parameters are consistent with Grade I diastolic dysfunction (impaired relaxation).  2. Right ventricular systolic function is normal. The right ventricular size is normal.  3. The mitral valve is normal in structure. Trivial  mitral valve regurgitation. No evidence of mitral stenosis.  4. The aortic valve is normal in structure. Aortic valve regurgitation is not visualized. No aortic stenosis is present.  5. The inferior vena cava is normal in size with greater than 50% respiratory variability, suggesting right atrial pressure of 3 mmHg. FINDINGS  Left Ventricle: Left ventricular ejection fraction, by estimation, is 55 to 60%. The left ventricle has normal function. The left ventricle has no regional wall motion abnormalities. The left ventricular internal cavity size was normal in size. There is  no left ventricular hypertrophy. Left ventricular diastolic parameters are consistent with Grade I diastolic dysfunction (impaired relaxation). Right Ventricle: The right ventricular size is normal. No increase in right ventricular wall thickness. Right ventricular systolic function is normal. Left Atrium: Left atrial size was normal in size. Right Atrium: Right atrial size was normal in size. Pericardium: There is no evidence of pericardial effusion. Mitral Valve: The mitral valve is normal in structure. Trivial mitral valve regurgitation. No evidence of mitral valve stenosis. Tricuspid Valve: The tricuspid valve is normal in structure. Tricuspid valve regurgitation is trivial. No evidence of tricuspid stenosis. Aortic Valve: The aortic valve is normal in structure. Aortic valve regurgitation is not visualized. No aortic stenosis is present. Aortic valve mean gradient measures 3.0 mmHg. Aortic valve peak gradient measures 4.8 mmHg. Aortic valve area, by VTI measures 2.99 cm. Pulmonic Valve: The pulmonic valve was normal in structure. Pulmonic valve regurgitation is not visualized. No evidence of pulmonic stenosis. Aorta: The aortic root is normal in size and structure. Venous: The inferior vena cava is normal in size with greater than 50% respiratory variability, suggesting right atrial pressure of 3 mmHg. IAS/Shunts: No atrial level shunt  detected by color flow Doppler.  LEFT VENTRICLE PLAX 2D LVIDd:         4.30 cm   Diastology LVIDs:         3.10 cm   LV e' medial:    6.20 cm/s LV PW:         1.00 cm   LV E/e' medial:  9.4 LV IVS:        1.70 cm   LV e' lateral:   9.25 cm/s LVOT diam:     2.10 cm   LV E/e' lateral: 6.3 LV SV:         53 LV SV Index:   24 LVOT Area:     3.46 cm  RIGHT VENTRICLE RV Basal diam:  4.60 cm RV Mid diam:    2.60 cm RV S prime:  13.70 cm/s TAPSE (M-mode): 2.2 cm LEFT ATRIUM             Index        RIGHT ATRIUM           Index LA diam:        3.00 cm 1.39 cm/m   RA Area:     13.10 cm LA Vol (A2C):   26.8 ml 12.45 ml/m  RA Volume:   27.60 ml  12.82 ml/m LA Vol (A4C):   21.4 ml 9.94 ml/m LA Biplane Vol: 24.6 ml 11.43 ml/m  AORTIC VALVE AV Area (Vmax):    2.99 cm AV Area (Vmean):   2.41 cm AV Area (VTI):     2.99 cm AV Vmax:           109.00 cm/s AV Vmean:          82.500 cm/s AV VTI:            0.176 m AV Peak Grad:      4.8 mmHg AV Mean Grad:      3.0 mmHg LVOT Vmax:         94.20 cm/s LVOT Vmean:        57.400 cm/s LVOT VTI:          0.152 m LVOT/AV VTI ratio: 0.86  AORTA Ao Root diam: 3.50 cm MITRAL VALVE               TRICUSPID VALVE MV Area (PHT): 2.51 cm    TR Peak grad:   19.5 mmHg MV Decel Time: 302 msec    TR Vmax:        221.00 cm/s MV E velocity: 58.20 cm/s MV A velocity: 81.20 cm/s  SHUNTS MV E/A ratio:  0.72        Systemic VTI:  0.15 m                            Systemic Diam: 2.10 cm Isaias Cowman MD Electronically signed by Isaias Cowman MD Signature Date/Time: 09/16/2022/1:37:48 PM    Final    CT ANGIO HEAD NECK W WO CM  Result Date: 09/16/2022 CLINICAL DATA:  Headache and dizziness EXAM: CT ANGIOGRAPHY HEAD AND NECK TECHNIQUE: Multidetector CT imaging of the head and neck was performed using the standard protocol during bolus administration of intravenous contrast. Multiplanar CT image reconstructions and MIPs were obtained to evaluate the vascular anatomy. Carotid stenosis  measurements (when applicable) are obtained utilizing NASCET criteria, using the distal internal carotid diameter as the denominator. RADIATION DOSE REDUCTION: This exam was performed according to the departmental dose-optimization program which includes automated exposure control, adjustment of the mA and/or kV according to patient size and/or use of iterative reconstruction technique. CONTRAST:  49m OMNIPAQUE IOHEXOL 350 MG/ML SOLN COMPARISON:  MRA head 08/08/2015 MRI brain 09/16/2022 FINDINGS: CTA NECK FINDINGS SKELETON: There is no bony spinal canal stenosis. No lytic or blastic lesion. OTHER NECK: Normal pharynx, larynx and major salivary glands. No cervical lymphadenopathy. Unremarkable thyroid gland. UPPER CHEST: No pneumothorax or pleural effusion. No nodules or masses. AORTIC ARCH: There is no calcific atherosclerosis of the aortic arch. There is no aneurysm, dissection or hemodynamically significant stenosis of the visualized portion of the aorta. Conventional 3 vessel aortic branching pattern. The visualized proximal subclavian arteries are widely patent. RIGHT CAROTID SYSTEM: Normal without aneurysm, dissection or stenosis. LEFT CAROTID SYSTEM: Normal without aneurysm, dissection or stenosis. VERTEBRAL ARTERIES: Left dominant  configuration. Both origins are clearly patent. There is no dissection, occlusion or flow-limiting stenosis to the skull base (V1-V3 segments). CTA HEAD FINDINGS POSTERIOR CIRCULATION: --Vertebral arteries: Normal V4 segments. --Inferior cerebellar arteries: Normal. --Basilar artery: Normal. --Superior cerebellar arteries: Normal. --Posterior cerebral arteries (PCA): Normal. ANTERIOR CIRCULATION: --Intracranial internal carotid arteries: Normal. 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 --Middle cerebral arteries (MCA): Normal. VENOUS SINUSES: As permitted by contrast timing, patent.  ANATOMIC VARIANTS: None Review of the MIP images confirms the above findings. IMPRESSION: 1. No emergent large vessel occlusion or high-grade stenosis of the intracranial arteries. 2. 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 3. Additional intracranial findings as described on earlier MRI. Electronically Signed   By: Ulyses Jarred M.D.   On: 09/16/2022 03:00   MR BRAIN WO CONTRAST  Result Date: 09/16/2022 CLINICAL DATA:  Acute neurologic deficit EXAM: MRI HEAD WITHOUT CONTRAST TECHNIQUE: Multiplanar, multiecho pulse sequences of the brain and surrounding structures were obtained without intravenous contrast. COMPARISON:  08/08/2015 FINDINGS: Brain: There are numerous foci of chronic microhemorrhage throughout both hemispheres, the brainstem and the cerebellum. Large cavernous malformation at the anterior left parietal lobe is unchanged. Cavernous malformation at the anterior right insula head is increased in size to 10 mm. 4 mm right frontal cavernous malformation is unchanged. There is bilateral occipital lobe encephalomalacia, left-greater-than-right. No acute infarct or acute hemorrhage. No midline shift or other mass effect. Vascular: Normal flow voids. Skull and upper cervical spine: Normal marrow signal. Sinuses/Orbits: Right maxillary sinus mucosal thickening Other: None IMPRESSION: 1. No acute intracranial abnormality. 2. Numerous foci of chronic microhemorrhage throughout both hemispheres, the brainstem and the cerebellum, in addition to larger lesions with peripheral siderosis. 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 3. Bilateral occipital lobe encephalomalacia, left-greater-than-right. Electronically Signed   By: Ulyses Jarred M.D.   On: 09/16/2022 02:31   CT HEAD WO CONTRAST  Result Date: 09/15/2022 CLINICAL DATA:  Dizziness EXAM: CT HEAD WITHOUT CONTRAST  TECHNIQUE: Contiguous axial images were obtained from the base of the skull through the vertex without intravenous contrast. RADIATION DOSE REDUCTION: This exam was performed according to the departmental dose-optimization program which includes automated exposure control, adjustment of the mA and/or kV according to patient size and/or use of iterative reconstruction technique. COMPARISON:  08/26/2009 FINDINGS: Brain: There are multiple intraparenchymal hyperdensities consistent with cavernous malformations. The largest is located in the posterior left frontal lobe and measures 14 mm. There is bilateral posterior cerebral artery territory encephalomalacia. No acute hemorrhage or extra-axial collection. Vascular: Negative Skull: Normal Sinuses/Orbits: Paranasal sinuses and mastoids are clear. Normal orbits. Other: None IMPRESSION: 1. No acute intracranial abnormality. 2. Multiple intraparenchymal hyperdensities consistent with cavernous malformations. 3. Bilateral posterior cerebral artery territory encephalomalacia. Electronically Signed   By: Ulyses Jarred M.D.   On: 09/15/2022 21:37    Microbiology: Results for orders placed or performed during the hospital encounter of 09/29/19  SARS CORONAVIRUS 2 (TAT 6-24 HRS) Nasopharyngeal Nasopharyngeal Swab     Status: None   Collection Time: 09/29/19  8:41 AM   Specimen: Nasopharyngeal Swab  Result Value Ref Range Status   SARS Coronavirus 2 NEGATIVE NEGATIVE Final    Comment: (NOTE) SARS-CoV-2 target nucleic acids are NOT DETECTED. The SARS-CoV-2 RNA is generally detectable in upper and lower respiratory specimens during the acute phase of infection. Negative results do not  preclude SARS-CoV-2 infection, do not rule out co-infections with other pathogens, and should not be used as the sole basis for treatment or other patient management decisions. Negative results must be combined with clinical observations, patient history, and epidemiological  information. The expected result is Negative. Fact Sheet for Patients: SugarRoll.be Fact Sheet for Healthcare Providers: https://www.woods-mathews.com/ This test is not yet approved or cleared by the [place] FDA and  has been authorized for detection and/or diagnosis of SARS-CoV-2 by FDA under an Emergency Use Authorization (EUA). This EUA will remain  in effect (meaning this test can be used) for the duration of the COVID-19 declaration under Section 56 4(b)(1) of the Act, 21 U.S.C. section 360bbb-3(b)(1), unless the authorization is terminated or revoked sooner. Performed at [facility], [place] [address], [place], [place] [address]     Labs: CBC: Recent Labs  Lab 09/15/22 2110 09/16/22 0434  WBC 9.3 10.3  NEUTROABS 6.5  --   HGB 16.9 16.8  HCT 49.4 50.0  MCV 83.0 85.3  PLT 366 0000000   Basic Metabolic Panel: Recent Labs  Lab 09/15/22 2110 09/16/22 0434  NA 134*  --   K 3.3*  --   CL 100  --   CO2 24  --   GLUCOSE 224*  --   BUN 16  --   CREATININE 1.10 1.11  CALCIUM 8.9  --    Liver Function Tests: Recent Labs  Lab 09/15/22 2110  AST 23  ALT 28  ALKPHOS 64  BILITOT 0.6  PROT 7.3  ALBUMIN 4.2   CBG: Recent Labs  Lab 09/15/22 2122 09/16/22 0750 09/16/22 1120  GLUCAP 240* 131* 114*    Discharge time spent: greater than 30 minutes.  This record has been created using Systems analyst. Errors have been sought and corrected,but may not always be located. Such creation errors do not reflect on the standard of care.   Signed: Lorella Nimrod, MD [facility] 09/16/2022

## 2022-09-16 NOTE — ED Provider Notes (Incomplete)
[facility] Provider Note    Event Date/Time   First MD Initiated Contact with Patient 09/15/22 2335     (approximate)   History   Dizziness and Headache   HPI  Alejandro Arias is a 62 y.o. male   Past medical history of ***    Independent Historian contributed to assessment above: ***  External Medical Documents Reviewed: ***      Physical Exam   Triage Vital Signs: ED Triage Vitals  Enc Vitals Group     BP 09/15/22 2110 (!) 141/91     Pulse Rate 09/15/22 2110 76     Resp 09/15/22 2110 18     Temp 09/15/22 2110 98.2 F (36.8 C)     Temp Source 09/15/22 2110 Oral     SpO2 09/15/22 2110 94 %     Weight 09/15/22 2108 210 lb (95.3 kg)     Height 09/15/22 2108 5' 11"$  (1.803 m)     Head Circumference --      Peak Flow --      Pain Score 09/15/22 2107 3     Pain Loc --      Pain Edu? --      Excl. in [place]? --     Most recent vital signs: Vitals:   09/15/22 2110  BP: (!) 141/91  Pulse: 76  Resp: 18  Temp: 98.2 F (36.8 C)  SpO2: 94%    General: Awake, no distress. *** CV:  Good peripheral perfusion. *** Resp:  Normal effort. *** Abd:  No distention. *** Other:  ***   ED Results / Procedures / Treatments   Labs (all labs ordered are listed, but only abnormal results are displayed) Labs Reviewed  CBC - Abnormal; Notable for the following components:      Result Value   RBC 5.95 (*)    All other components within normal limits  COMPREHENSIVE METABOLIC PANEL - Abnormal; Notable for the following components:   Sodium 134 (*)    Potassium 3.3 (*)    Glucose, Bld 224 (*)    All other components within normal limits  CBG MONITORING, ED - Abnormal; Notable for the following components:   Glucose-Capillary 240 (*)    All other components within normal limits  PROTIME-INR  APTT  DIFFERENTIAL  ETHANOL  TROPONIN I (HIGH SENSITIVITY)  TROPONIN I (HIGH SENSITIVITY)     I ordered and reviewed the above labs they are notable  for ***  EKG  ED ECG REPORT I, Lucillie Garfinkel, the attending physician, personally viewed and interpreted this ECG.   Date: 09/16/2022  EKG Time: ***  Rate: ***  Rhythm: {ekg findings:315101}  Axis: ***  Intervals:{conduction defects:17367}  ST&T Change: ***    RADIOLOGY I independently reviewed and interpreted ***   PROCEDURES:  Critical Care performed: {CriticalCareYesNo:19197::"Yes, see critical care procedure note(s)","No"}  Procedures   MEDICATIONS ORDERED IN ED: Medications - No data to display  External physician / consultants:  I spoke with *** regarding care plan for this patient.   IMPRESSION / MDM / ASSESSMENT AND PLAN / ED COURSE  I reviewed the triage vital signs and the nursing notes.                                Patient's presentation is most consistent with {EM COPA:27473}  Differential diagnosis includes, but is not limited to, ***   ***The patient is on  the cardiac monitor to evaluate for evidence of arrhythmia and/or significant heart rate changes.  MDM:  ***  I considered hospitalization for admission or observation ***        FINAL CLINICAL IMPRESSION(S) / ED DIAGNOSES   Final diagnoses:  None     Rx / DC Orders   ED Discharge Orders     None        Note:  This document was prepared using Dragon voice recognition software and may include unintentional dictation errors.,dm

## 2022-09-16 NOTE — ED Provider Notes (Signed)
[facility] Provider Note    Event Date/Time   First MD Initiated Contact with Patient 09/15/22 2335     (approximate)   History   Dizziness and Headache   HPI  Alejandro Arias is a 62 y.o. male   Past medical history of stroke with residual visual deficits, brain aneurysm, skin cancer, hypertension hyperlipidemia and short-term memory loss who presents to the emergency department with several discrete episodes of dizziness starting this morning around 11 AM while at rest.  Episodes lasting around 30 seconds where he states he feels lightheaded/dizzy and altered sensation of smell that then spontaneously resolves.  Has had approximately 6 episodes throughout the day.  There is no pattern to these episodes no exacerbating or alleviating factors noted by the patient.  Is otherwise been in his regular state of health with no preceding illnesses or trauma.  He has no other acute medical complaints.  He specifically denies chest pain, shortness of breath, any respiratory, GI or GU symptoms.  Independent Historian contributed to assessment above: His wife at bedside  External Medical Documents Reviewed: Cardiology visit dated 08/31/2022      Physical Exam   Triage Vital Signs: ED Triage Vitals  Enc Vitals Group     BP 09/15/22 2110 (!) 141/91     Pulse Rate 09/15/22 2110 76     Resp 09/15/22 2110 18     Temp 09/15/22 2110 98.2 F (36.8 C)     Temp Source 09/15/22 2110 Oral     SpO2 09/15/22 2110 94 %     Weight 09/15/22 2108 210 lb (95.3 kg)     Height 09/15/22 2108 5' 11"$  (1.803 m)     Head Circumference --      Peak Flow --      Pain Score 09/15/22 2107 3     Pain Loc --      Pain Edu? --      Excl. in [place]? --     Most recent vital signs: Vitals:   09/15/22 2110 09/16/22 0000  BP: (!) 141/91 126/87  Pulse: 76 63  Resp: 18 17  Temp: 98.2 F (36.8 C)   SpO2: 94% 93%    General: Awake, no distress.  CV:  Good peripheral perfusion.   Resp:  Normal effort.  Abd:  No distention.  Other:  Wake alert oriented comfortable appearing.  No dysarthria or facial asymmetry, motor or sensory intact to all extremities, finger-to-nose is normal.  He is at baseline mentation per wife.  He is following all commands.  Heart sounds normal lung sounds normal, abdomen soft and nontender skin appears warm and well-perfused.  He does have some visual deficits including right-sided visual deficits field cuts to both eyes.   ED Results / Procedures / Treatments   Labs (all labs ordered are listed, but only abnormal results are displayed) Labs Reviewed  CBC - Abnormal; Notable for the following components:      Result Value   RBC 5.95 (*)    All other components within normal limits  COMPREHENSIVE METABOLIC PANEL - Abnormal; Notable for the following components:   Sodium 134 (*)    Potassium 3.3 (*)    Glucose, Bld 224 (*)    All other components within normal limits  CBG MONITORING, ED - Abnormal; Notable for the following components:   Glucose-Capillary 240 (*)    All other components within normal limits  PROTIME-INR  APTT  DIFFERENTIAL  ETHANOL  TROPONIN  I (HIGH SENSITIVITY)  TROPONIN I (HIGH SENSITIVITY)     I ordered and reviewed the above labs they are notable for he has a blood glucose of 224 and a normal anion gap.  Troponins have been negative.  EKG  ED ECG REPORT I, Lucillie Garfinkel, the attending physician, personally viewed and interpreted this ECG.   Date: 09/16/2022  EKG Time: 2110  Rate: 76  Rhythm: nsr  Axis: nl  Intervals:none  ST&T Change: No acute ischemic changes    RADIOLOGY I independently reviewed and interpreted CT head see no obvious bleeding or midline shift   PROCEDURES:  Critical Care performed: No  Procedures   MEDICATIONS ORDERED IN ED: Medications - No data to display  External physician / consultants:  I spoke with hospitalist for admission and regarding care plan for this  patient.   IMPRESSION / MDM / ASSESSMENT AND PLAN / ED COURSE  I reviewed the triage vital signs and the nursing notes.                                Patient's presentation is most consistent with acute presentation with potential threat to life or bodily function.  Differential diagnosis includes, but is not limited to, TIA/CVA, head bleed, vertigo versus central, electrolyte derangement, dysrhythmia   The patient is on the cardiac monitor to evaluate for evidence of arrhythmia and/or significant heart rate changes.  MDM: Patient on dual antiplatelet therapy from prior stroke with residual deficits now with several discrete episodes of dizziness with no other associated symptoms.  Concern for TIA.  CT head negative.  No symptoms currently.  Given no chest pain palpitations shortness of breath or any other associated symptoms I doubt this is due to ACS or dysrhythmias.  Considered peripheral vertigo but not exacerbated by head movements and has been transient discrete episodes. Admit for TIA workup.          FINAL CLINICAL IMPRESSION(S) / ED DIAGNOSES   Final diagnoses:  Dizziness     Rx / DC Orders   ED Discharge Orders     None        Note:  This document was prepared using Dragon voice recognition software and may include unintentional dictation errors.,dm    Lucillie Garfinkel, MD 09/16/22 0149

## 2022-09-16 NOTE — Evaluation (Signed)
Physical Therapy Evaluation Patient Details Name: Alejandro Arias MRN: [id number] DOB: [birth year] Today's Date: 09/16/2022  History of Present Illness  62 y.o. 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 He arrives with consistent bouts of brief (<30 sec) dizziness that is not vertigious.  Clinical Impression  Pt was able to do all mobility tasks w/o issue. Easily up to EOB, standing, community appropriate ambulation, no LOBs or safety issues.  Performed Dix-Hallpike, (-) b/l and pt does not at vertigo symptoms (just dizzy).  Pt with no symptoms, pain, no fatigue, no issues with 300 ft of ambulation with challenges.  Pt and wife reports he appears at baseline, no safety or change in status issues from a PT stand-point.  Good to go home, no further PT needs.      Recommendations for follow up therapy are one component of a multi-disciplinary discharge planning process, led by the attending physician.  Recommendations may be updated based on patient status, additional functional criteria and insurance authorization.  Follow Up Recommendations No PT follow up      Assistance Recommended at Discharge PRN  Patient can return home with the following       Equipment Recommendations None recommended by PT  Recommendations for Other Services       Functional Status Assessment Patient has not had a recent decline in their functional status     Precautions / Restrictions Precautions Precautions: Fall Restrictions Weight Bearing Restrictions: No      Mobility  Bed Mobility Overal bed mobility: Independent             General bed mobility comments: easily gets in/out of bed multiple times w/o hesitation     Transfers Overall transfer level: Independent Equipment used: None               General transfer comment: confident and with good balance, no issues    Ambulation/Gait Ambulation/Gait assistance: Independent Gait Distance (Feet): 300 Feet Assistive device: None         General Gait Details: Pt was able to easily ambulate at community appropriate speed w/o AD and showed good ability to do speed changes, stops (with and w/o turns), head movements, etc all w/o issue.  At baseline  Stairs            Wheelchair Mobility    Modified Rankin (Stroke Patients Only)       Balance Overall balance assessment: Independent                                           Pertinent Vitals/Pain Pain Assessment Pain Assessment: No/denies pain    Home Living Family/patient expects to be discharged to:: Private residence Living Arrangements: Spouse/significant other                      Prior Function Prior Level of Function : Independent/Modified Independent             Mobility Comments: Pt able to be up and active, works with tools, no AD ADLs Comments: independent     Hand Dominance        Extremity/Trunk Assessment   Upper Extremity Assessment  Upper Extremity Assessment: Overall WFL for tasks assessed    Lower Extremity Assessment Lower Extremity Assessment: Overall WFL for tasks assessed       Communication   Communication: No difficulties  Cognition Arousal/Alertness: Awake/alert Behavior During Therapy: WFL for tasks assessed/performed Overall Cognitive Status: Within Functional Limits for tasks assessed                                          General Comments General comments (skin integrity, edema, etc.): Pt reports feeling at baseline apart from intermittent brief dizzy spells    Exercises     Assessment/Plan    PT Assessment Patient does not need any further PT services  PT Problem List          PT Treatment Interventions      PT Goals (Current goals can be found in the Care Plan section)  Acute Rehab PT Goals Patient Stated Goal: Go home PT Goal Formulation: All assessment and education complete, DC therapy    Frequency       Co-evaluation               AM-PAC PT "6 Clicks" Mobility  Outcome Measure Help needed turning from your back to your side while in a flat bed without using bedrails?: None Help needed moving from lying on your back to sitting on the side of a flat bed without using bedrails?: None Help needed moving to and from a bed to a chair (including a wheelchair)?: None Help needed standing up from a chair using your arms (e.g., wheelchair or bedside chair)?: None Help needed to walk in hospital room?: None Help needed climbing 3-5 steps with a railing? : None 6 Click Score: 24    End of Session   Activity Tolerance: Patient tolerated treatment well Patient left: in bed;with call bell/phone within reach   PT Visit Diagnosis: Other symptoms and signs involving the nervous system [id number])    Time: [id number] PT Time Calculation (min) (ACUTE ONLY): 16 min   Charges:   PT Evaluation $PT Eval Low Complexity: 1 Low          Kreg Shropshire, DPT 09/16/2022, 12:11 PM

## 2022-11-23 ENCOUNTER — Other Ambulatory Visit: Payer: Self-pay

## 2022-11-23 ENCOUNTER — Emergency Department: Payer: Federal, State, Local not specified - PPO

## 2022-11-23 ENCOUNTER — Encounter: Payer: Self-pay | Admitting: *Deleted

## 2022-11-23 DIAGNOSIS — S83412A Sprain of medial collateral ligament of left knee, initial encounter: Secondary | ICD-10-CM | POA: Insufficient documentation

## 2022-11-23 DIAGNOSIS — Y990 Civilian activity done for income or pay: Secondary | ICD-10-CM | POA: Diagnosis not present

## 2022-11-23 DIAGNOSIS — S8992XA Unspecified injury of left lower leg, initial encounter: Secondary | ICD-10-CM | POA: Diagnosis present

## 2022-11-23 DIAGNOSIS — Y92019 Unspecified place in single-family (private) house as the place of occurrence of the external cause: Secondary | ICD-10-CM | POA: Diagnosis not present

## 2022-11-23 DIAGNOSIS — W172XXA Fall into hole, initial encounter: Secondary | ICD-10-CM | POA: Insufficient documentation

## 2022-11-23 NOTE — ED Triage Notes (Signed)
Pt has left knee pain.  Pt injured yesterday after falling into a hole.  Pt reports swelling.  Pt alert

## 2022-11-24 ENCOUNTER — Emergency Department
Admission: EM | Admit: 2022-11-24 | Discharge: 2022-11-24 | Disposition: A | Discharge status: Home / Self Care | Payer: Federal, State, Local not specified - PPO | Attending: Emergency Medicine | Admitting: Emergency Medicine

## 2022-11-24 DIAGNOSIS — S83412A Sprain of medial collateral ligament of left knee, initial encounter: Secondary | ICD-10-CM

## 2022-11-24 DIAGNOSIS — M25562 Pain in left knee: Secondary | ICD-10-CM

## 2022-11-24 MED ORDER — HYDROCODONE-ACETAMINOPHEN 5-325 MG PO TABS
2.0000 | ORAL_TABLET | Freq: Once | ORAL | Status: AC
  Administered 2022-11-24: 2 via ORAL
  Filled 2022-11-24: qty 2

## 2022-11-24 MED ORDER — HYDROCODONE-ACETAMINOPHEN 5-325 MG PO TABS: 1.0000 | ORAL_TABLET | Freq: Four times a day (QID) | ORAL | 0 refills | Status: AC | PRN

## 2022-11-24 MED ORDER — ONDANSETRON 4 MG PO TBDP
4.0000 mg | ORAL_TABLET | Freq: Once | ORAL | Status: AC
  Administered 2022-11-24: 4 mg via ORAL
  Filled 2022-11-24: qty 1

## 2022-11-24 NOTE — ED Provider Notes (Signed)
[facility] Provider Note    Event Date/Time   First MD Initiated Contact with Patient 11/24/22 0028     (approximate)   History   Knee Injury   HPI  Alejandro Arias is a 62 y.o. male  here with left knee injury. Pt was working on a house yesterday when he stepped down into a grate accidentally with his right foot. His left knee buckled inward. He had mild to moderate pain at the time. Today, he has had severe pain worse with moving and weightbearing though he is able to stand still fairly well. Pain is worse along medial aspect of knee. No open wounds. No other injuries. No numbness or weakness.       Physical Exam   Triage Vital Signs: ED Triage Vitals  Enc Vitals Group     BP 11/23/22 2157 (!) 122/90     Pulse Rate 11/23/22 2157 79     Resp 11/23/22 2157 18     Temp 11/23/22 2157 98.5 F (36.9 C)     Temp Source 11/23/22 2157 Oral     SpO2 11/23/22 2157 95 %     Weight 11/23/22 2158 215 lb (97.5 kg)     Height 11/23/22 2158 5\' 11"  (1.803 m)     Head Circumference --      Peak Flow --      Pain Score 11/23/22 2206 1     Pain Loc --      Pain Edu? --      Excl. in [place]? --     Most recent vital signs: Vitals:   11/23/22 2157  BP: (!) 122/90  Pulse: 79  Resp: 18  Temp: 98.5 F (36.9 C)  SpO2: 95%     General: Awake, no distress.  CV:  Good peripheral perfusion.  Resp:  Normal work of breathing.  Abd:  No distention.  Other:  LLE with moderate TTP and mild swelling along medial aspect of knee. No ligamentous instability noted on valgus/varus stress testing and anterior/posterior drawer sign. Distal strength and sensation intact. Distal NV intact.   ED Results / Procedures / Treatments   Labs (all labs ordered are listed, but only abnormal results are displayed) Labs Reviewed - No data to display   EKG    RADIOLOGY DG Knee Left: Negative   I also independently reviewed and agree with radiologist  interpretations.   PROCEDURES:  Critical Care performed: No    MEDICATIONS ORDERED IN ED: Medications  HYDROcodone-acetaminophen (NORCO/VICODIN) 5-325 MG per tablet 2 tablet (has no administration in time range)  ondansetron (ZOFRAN-ODT) disintegrating tablet 4 mg (has no administration in time range)     IMPRESSION / MDM / ASSESSMENT AND PLAN / ED COURSE  I reviewed the triage vital signs and the nursing notes.                              Differential diagnosis includes, but is not limited to, knee sprain, MCL tear/injury, meniscal injury, fracture  Patient's presentation is most consistent with acute complicated illness / injury requiring diagnostic workup.  62 yo M here with left medial knee pain after injury. No direct trauma. Plain films negative. Distal NV is intact. Pt has no major ligamentous instability on testing. Significant TTP over MCL/medial joint line. No clicking or popping appreciated. Hinged knee brace, d/c with outpt follow-up.     FINAL CLINICAL IMPRESSION(S) / ED DIAGNOSES  Final diagnoses:  Acute pain of left knee  Sprain of medial collateral ligament of left knee, initial encounter     Rx / DC Orders   ED Discharge Orders          Ordered    HYDROcodone-acetaminophen (NORCO/VICODIN) 5-325 MG tablet  Every 6 hours PRN        11/24/22 0057             Note:  This document was prepared using Dragon voice recognition software and may include unintentional dictation errors.   Shaune Pollack, MD 11/24/22 [phone]

## 2022-11-24 NOTE — ED Notes (Signed)
Pt Dc to home. Dc instructions reviewed with all questions answered. Knee brace applied as ordered. Pt assisted out of dept via wheelchair

## 2023-04-13 ENCOUNTER — Encounter: Payer: Self-pay | Admitting: Urology

## 2023-04-13 ENCOUNTER — Ambulatory Visit: Payer: Self-pay | Admitting: Urology

## 2023-05-27 ENCOUNTER — Ambulatory Visit: Payer: Self-pay | Admitting: Urology

## 2023-06-21 ENCOUNTER — Encounter: Payer: Self-pay | Admitting: Urology

## 2023-06-21 ENCOUNTER — Ambulatory Visit: Payer: Federal, State, Local not specified - PPO | Admitting: Urology

## 2023-06-21 VITALS — BP 141/90 | HR 72 | Ht 71.0 in | Wt 210.0 lb

## 2023-06-21 DIAGNOSIS — R972 Elevated prostate specific antigen [PSA]: Secondary | ICD-10-CM | POA: Diagnosis not present

## 2023-06-21 DIAGNOSIS — N401 Enlarged prostate with lower urinary tract symptoms: Secondary | ICD-10-CM

## 2023-06-21 NOTE — Progress Notes (Signed)
I, [name], acting as a scribe for Alejandro Altes, MD., have documented all relevant documentation on the behalf of Alejandro Altes, MD, as directed by Alejandro Altes, MD while in the presence of Alejandro Altes, MD.  06/21/2023 10:32 AM   [name] [birth year] [id number]  Referring provider: Enid Baas, MD [address] [place],  [place] [address]  Chief Complaint  Patient presents with   Elevated PSA    HPI: Alejandro Arias is a 62 y.o. male referred for evaluation of an elevated PSA.   Baseline PSA 2016-2017 was in the upper 2-low 3 range.  On record review, his PSA was not checked again until March 2023 and was 4.35  It was repeated 1 week later and was 3.95  Repeat PSAs in May 2024 and June 2024 5.20 and 4.46 respectively.  Had a follow-up visit August 2024, he was complaining of bothersome lower urinary tract symptoms, including urinary hesitancy, sensation of incomplete emptying, frequency and urgency He was started on tamsulosin and has noted significant improvement in his lower urinary tract symptoms.  PSA has not been rechecked since June 2024.  Denies dysuria, gross hematuria  No flank, abdominal, or pelvic pain  No family history of prostate cancer   PMH: Past Medical History:  Diagnosis Date   Allergic state    Aneurysm (HCC)    Brain   Asthma, chronic 09/16/2022   Cancer (HCC)    Skin   Cerebral aneurysm    Cerebral aneurysm    Cerebral vascular disease    Chicken pox    GERD (gastroesophageal reflux disease)    Hyperlipemia    Hyperlipidemia    Hypertension    Shingles    Short-term memory loss    Stroke [facility])     Surgical History: Past Surgical History:  Procedure Laterality Date   APPENDECTOMY     CARDIAC CATHETERIZATION Left 07/16/2016   Procedure: Left Heart Cath and Coronary Angiography;  Surgeon: Alwyn Pea, MD;  Location: [facility] INVASIVE CV LAB;  Service: Cardiovascular;  Laterality: Left;   CARDIAC  CATHETERIZATION N/A 07/16/2016   Procedure: Coronary Stent Intervention;  Surgeon: Alwyn Pea, MD;  Location: [facility] INVASIVE CV LAB;  Service: Cardiovascular;  Laterality: N/A;   COLONOSCOPY     COLONOSCOPY WITH PROPOFOL N/A 10/21/2017   Procedure: COLONOSCOPY WITH PROPOFOL;  Surgeon: Christena Deem, MD;  Location[facility] ENDOSCOPY;  Service: Endoscopy;  Laterality: N/A;   COLONOSCOPY WITH PROPOFOL N/A 08/26/2018   Procedure: COLONOSCOPY WITH PROPOFOL;  Surgeon: Christena Deem, MD;  Location: [facility] ENDOSCOPY;  Service: Endoscopy;  Laterality: N/A;   DISTAL BICEPS TENDON REPAIR Left 10/03/2019   Procedure: DISTAL BICEPS TENDON REPAIR;  Surgeon: Christena Flake, MD;  Location: [facility] ORS;  Service: Orthopedics;  Laterality: Left;   TONSILLECTOMY     VASECTOMY      Home Medications:  Allergies as of 06/21/2023       Reactions   Morphine Anaphylaxis, Nausea And Vomiting   Other reaction(s): Vomiting   Lipitor [atorvastatin Calcium] Other (See Comments)   Aches and pains   Morphine And Codeine Nausea And Vomiting        Medication List        Accurate as of June 21, 2023 10:32 AM. If you have any questions, ask your nurse or doctor.          amLODipine 5 MG tablet Commonly known as: NORVASC Take 1 tablet by  mouth 2 (two) times daily.   aspirin EC 81 MG tablet Take 81 mg by mouth daily.   clopidogrel 75 MG tablet Commonly known as: PLAVIX Take 75 mg by mouth daily.   cyanocobalamin 1000 MCG tablet Commonly known as: VITAMIN B12 Take 1,000 mcg by mouth daily.   ezetimibe 10 MG tablet Commonly known as: ZETIA Take 10 mg by mouth daily.   HYDROcodone-acetaminophen 5-325 MG tablet Commonly known as: NORCO/VICODIN Take 1-2 tablets by mouth every 6 (six) hours as needed for moderate pain or severe pain (no more than 6 tabs daily).   losartan-hydrochlorothiazide 100-12.5 MG tablet Commonly known as: HYZAAR Take 1 tablet by mouth daily.   metoprolol succinate  25 MG 24 hr tablet Commonly known as: TOPROL-XL Take 50 mg by mouth daily.   rosuvastatin 40 MG tablet Commonly known as: CRESTOR Take 1 tablet (40 mg total) by mouth daily at 6 PM. What changed: when to take this   tamsulosin 0.4 MG Caps capsule Commonly known as: FLOMAX Take 0.4 mg by mouth daily.        Allergies:  Allergies  Allergen Reactions   Morphine Anaphylaxis and Nausea And Vomiting    Other reaction(s): Vomiting   Lipitor [Atorvastatin Calcium] Other (See Comments)    Aches and pains   Morphine And Codeine Nausea And Vomiting    Family History: Family History  Problem Relation Age of Onset   Colon cancer Mother    Diabetes Mother    Hypertension Mother    Heart attack Father    Seizures Father     Social History:  reports that he has never smoked. He has never used smokeless tobacco. He reports current alcohol use of about 2.0 standard drinks of alcohol per week. He reports that he does not use drugs.   Physical Exam: BP (!) 141/90   Pulse 72   Ht 5\' 11"  (1.803 m)   Wt 210 lb (95.3 kg)   BMI 29.29 kg/m   Constitutional:  Alert and oriented, No acute distress. HEENT:  AT Respiratory: Normal respiratory effort, no increased work of breathing. GU: Prostate 35 grams, smooth without nodules.  Psychiatric: Normal mood and affect.   Assessment & Plan:    1. Elevated PSA Although PSA is a prostate cancer screening test he was informed that cancer is not the most common cause of an elevated PSA. Other potential causes including BPH and inflammation were discussed.  He was informed that the only way to adequately diagnose prostate cancer would be transrectal ultrasound and biopsy of the prostate. The procedure was discussed including potential risks of bleeding and infection/sepsis. He was also informed that a negative biopsy does not conclusively rule out the possibility that prostate cancer may be present and that continued monitoring is required.  The  use of newer adjunctive blood and urine tests were discussed.  The use of multiparametric prostate MRI to evaluate for lesions suspicious for high grade prostate cancer and aid in targeted bx was reviewed.  Continued periodic surveillance was also discussed.  He was having bothersome lower urinary tract symptoms and his prior PSA elevation could reflect inflammation. I recommend repeat PSA today; if 3.5 or greater, recommend further evaluation with prostate MRI and if lower, surveillance.   2.  BPH with lower urinary tract symptoms Significant improvement on tamsulosin   I have reviewed the above documentation for accuracy and completeness, and I agree with the above.   Alejandro Altes, MD  [facility]  [address], [address] [place], [place] [address] [phone]

## 2023-06-22 LAB — PSA: Prostate Specific Ag, Serum: 4.7 ng/mL — ABNORMAL HIGH (ref 0.0–4.0)

## 2023-06-23 ENCOUNTER — Other Ambulatory Visit: Payer: Self-pay | Admitting: *Deleted

## 2023-06-23 DIAGNOSIS — R972 Elevated prostate specific antigen [PSA]: Secondary | ICD-10-CM

## 2023-06-23 NOTE — Progress Notes (Signed)
Left message for patient to return the call.

## 2023-07-05 ENCOUNTER — Ambulatory Visit
Admission: RE | Admit: 2023-07-05 | Discharge: 2023-07-05 | Disposition: A | Discharge status: Home / Self Care | Payer: Federal, State, Local not specified - PPO | Source: Ambulatory Visit | Attending: Urology | Admitting: Urology

## 2023-07-05 DIAGNOSIS — R972 Elevated prostate specific antigen [PSA]: Secondary | ICD-10-CM | POA: Insufficient documentation

## 2023-07-05 MED ORDER — GADOBUTROL 1 MMOL/ML IV SOLN
10.0000 mL | Freq: Once | INTRAVENOUS | Status: AC | PRN
  Administered 2023-07-05: 10 mL via INTRAVENOUS

## 2023-07-07 ENCOUNTER — Telehealth: Payer: Self-pay | Admitting: Urology

## 2023-07-07 NOTE — Telephone Encounter (Signed)
Prostate MRI showed no abnormality suspicious for high-grade prostate cancer.  Recommend 67-month follow-up with PSA

## 2023-08-24 ENCOUNTER — Other Ambulatory Visit: Payer: Self-pay | Admitting: Orthopedic Surgery

## 2023-08-24 DIAGNOSIS — S46211D Strain of muscle, fascia and tendon of other parts of biceps, right arm, subsequent encounter: Secondary | ICD-10-CM

## 2023-08-28 ENCOUNTER — Ambulatory Visit
Admission: RE | Admit: 2023-08-28 | Discharge: 2023-08-28 | Disposition: A | Discharge status: Home / Self Care | Payer: Federal, State, Local not specified - PPO | Source: Ambulatory Visit | Attending: Orthopedic Surgery | Admitting: Orthopedic Surgery

## 2023-08-28 DIAGNOSIS — S46211D Strain of muscle, fascia and tendon of other parts of biceps, right arm, subsequent encounter: Secondary | ICD-10-CM

## 2024-01-03 ENCOUNTER — Other Ambulatory Visit: Payer: Self-pay | Admitting: *Deleted

## 2024-01-03 DIAGNOSIS — R972 Elevated prostate specific antigen [PSA]: Secondary | ICD-10-CM

## 2024-01-05 ENCOUNTER — Other Ambulatory Visit: Payer: Self-pay

## 2024-01-07 ENCOUNTER — Ambulatory Visit: Payer: Self-pay | Admitting: Urology

## 2024-06-19 ENCOUNTER — Encounter: Payer: Self-pay | Admitting: *Deleted

## 2024-06-26 ENCOUNTER — Encounter
Admission: RE | Disposition: A | Discharge status: Home / Self Care | Payer: Self-pay | Source: Home / Self Care | Attending: Gastroenterology

## 2024-06-26 ENCOUNTER — Ambulatory Visit: Admitting: Certified Registered"

## 2024-06-26 ENCOUNTER — Ambulatory Visit
Admission: RE | Admit: 2024-06-26 | Discharge: 2024-06-26 | Disposition: A | Discharge status: Home / Self Care | Attending: Gastroenterology | Admitting: Gastroenterology

## 2024-06-26 HISTORY — PX: ESOPHAGOGASTRODUODENOSCOPY: SHX5428

## 2024-06-26 HISTORY — PX: COLONOSCOPY: SHX5424

## 2024-06-26 SURGERY — COLONOSCOPY
Anesthesia: General

## 2024-06-26 MED ORDER — SODIUM CHLORIDE 0.9 % IV SOLN
INTRAVENOUS | Status: DC
  Administered 2024-06-26: 500 mL via INTRAVENOUS

## 2024-06-26 MED ORDER — PROPOFOL 500 MG/50ML IV EMUL
INTRAVENOUS | Status: DC | PRN
  Administered 2024-06-26: 100 ug/kg/min via INTRAVENOUS
  Administered 2024-06-26: 50 mg via INTRAVENOUS

## 2024-06-26 NOTE — H&P (Signed)
 Outpatient short stay form Pre-procedure 06/26/2024  Alejandro ONEIDA Schick, MD  Primary Physician: Sherial Bail, MD  Reason for visit:  Dysphagia/Screening  History of present illness:    63 y/o gentleman with history of hypertension, CVA on plavix  (last dose 5 days ago), and HLD here for EGD for dysphagia to solids foods and screening colonoscopy. Family history of colon cancer in his mother in her 47's. History of appendectomy.    Current Facility-Administered Medications:    0.9 %  sodium chloride  infusion, , Intravenous, Continuous, Jihan Mellette, Alejandro ONEIDA, MD, Last Rate: 20 mL/hr at 06/26/24 1111, 500 mL at 06/26/24 1111  Medications Prior to Admission  Medication Sig Dispense Refill Last Dose/Taking   amLODipine  (NORVASC ) 5 MG tablet Take 1 tablet by mouth 2 (two) times daily.   06/26/2024 Morning   aspirin  EC 81 MG tablet Take 81 mg by mouth daily.   Past Week   losartan -hydrochlorothiazide  (HYZAAR) 100-12.5 MG tablet Take 1 tablet by mouth daily.   06/26/2024 Morning   metoprolol  succinate (TOPROL -XL) 25 MG 24 hr tablet Take 50 mg by mouth daily.    06/26/2024 Morning   tamsulosin (FLOMAX) 0.4 MG CAPS capsule Take 0.4 mg by mouth daily.   06/26/2024 Morning   vitamin B-12 (CYANOCOBALAMIN) 1000 MCG tablet Take 1,000 mcg by mouth daily.   Past Week   clopidogrel  (PLAVIX ) 75 MG tablet Take 75 mg by mouth daily.   06/21/2024   ezetimibe (ZETIA) 10 MG tablet Take 10 mg by mouth daily.      rosuvastatin  (CRESTOR ) 40 MG tablet Take 1 tablet (40 mg total) by mouth daily at 6 PM. (Patient taking differently: Take 40 mg by mouth daily.) 30 tablet 12      Allergies  Allergen Reactions   Morphine Anaphylaxis and Nausea And Vomiting    Other reaction(s): Vomiting   Lipitor [Atorvastatin Calcium ] Other (See Comments)    Aches and pains   Morphine And Codeine Nausea And Vomiting     Past Medical History:  Diagnosis Date   Allergic state    Aneurysm    Brain   Cancer (HCC)    Skin    Cerebral aneurysm    Cerebral vascular disease    Chicken pox    GERD (gastroesophageal reflux disease)    Hyperlipemia    Hyperlipidemia    Hypertension    Shingles    Short-term memory loss    Stroke [facility])     Review of systems:  Otherwise negative.    Physical Exam  Gen: Alert, oriented. Appears stated age.  HEENT: PERRLA. Lungs: No respiratory distress CV: RRR Abd: soft, benign, no masses Ext: No edema    Planned procedures: Proceed with EGD/colonoscopy. The patient understands the nature of the planned procedure, indications, risks, alternatives and potential complications including but not limited to bleeding, infection, perforation, damage to internal organs and possible oversedation/side effects from anesthesia. The patient agrees and gives consent to proceed.  Please refer to procedure notes for findings, recommendations and patient disposition/instructions.     Alejandro ONEIDA Schick, MD [facility]

## 2024-06-26 NOTE — Interval H&P Note (Signed)
 History and Physical Interval Note:  06/26/2024 11:22 AM  Alejandro Arias  has presented today for surgery, with the diagnosis of esophageal dysphagia,family hx of colon cancer.  The various methods of treatment have been discussed with the patient and family. After consideration of risks, benefits and other options for treatment, the patient has consented to  Procedure(s): COLONOSCOPY (N/A) EGD (ESOPHAGOGASTRODUODENOSCOPY) (N/A) as a surgical intervention.  The patient's history has been reviewed, patient examined, no change in status, stable for surgery.  I have reviewed the patient's chart and labs.  Questions were answered to the patient's satisfaction.     [name]  Ok to proceed with EGD/Colonoscopy

## 2024-06-26 NOTE — Anesthesia Preprocedure Evaluation (Signed)
 Anesthesia Evaluation  Patient identified by MRN, date of birth, ID band Patient awake    Reviewed: Allergy & Precautions, NPO status , Patient's Chart, lab work & pertinent test results  History of Anesthesia Complications Negative for: history of anesthetic complications  Airway Mallampati: III  TM Distance: <3 FB Neck ROM: full    Dental  (+) Chipped   Pulmonary neg shortness of breath, asthma , sleep apnea    Pulmonary exam normal        Cardiovascular Exercise Tolerance: Good hypertension, (-) angina + CAD  Normal cardiovascular exam     Neuro/Psych TIACVA  negative psych ROS   GI/Hepatic Neg liver ROS,GERD  Controlled,,  Endo/Other  negative endocrine ROS    Renal/GU negative Renal ROS  negative genitourinary   Musculoskeletal   Abdominal   Peds  Hematology negative hematology ROS (+)   Anesthesia Other Findings Patient reports that they do not think that any food or pills are stuck in their throat at this time.  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 No date: Cerebral vascular disease No date: Chicken pox No date: GERD (gastroesophageal reflux disease) No date: Hyperlipemia No date: Hyperlipidemia No date: Hypertension No date: Shingles No date: Short-term memory loss No date: Stroke [facility])  Past Surgical History: No date: APPENDECTOMY 07/16/2016: CARDIAC CATHETERIZATION; Left     Comment:  Procedure: Left Heart Cath and Coronary Angiography;                Surgeon: Cara JONETTA Lovelace, MD;  Location: [facility] INVASIVE               CV LAB;  Service: Cardiovascular;  Laterality: Left; 07/16/2016: CARDIAC CATHETERIZATION; N/A     Comment:  Procedure: Coronary Stent Intervention;  Surgeon: Cara JONETTA Lovelace, MD;  Location: [facility] INVASIVE CV LAB;                Service:  Cardiovascular;  Laterality: N/A; No date: COLONOSCOPY 10/21/2017: COLONOSCOPY WITH PROPOFOL ; N/A     Comment:  Procedure: COLONOSCOPY WITH PROPOFOL ;  Surgeon:               Gaylyn Gladis PENNER, MD;  Location: [facility] ENDOSCOPY;                Service: Endoscopy;  Laterality: N/A; 08/26/2018: COLONOSCOPY WITH PROPOFOL ; N/A     Comment:  Procedure: COLONOSCOPY WITH PROPOFOL ;  Surgeon:               Gaylyn Gladis PENNER, MD;  Location: [facility] ENDOSCOPY;                Service: Endoscopy;  Laterality: N/A; No date: CORONARY ANGIOPLASTY 10/03/2019: DISTAL BICEPS TENDON REPAIR; Left     Comment:  Procedure: DISTAL BICEPS TENDON REPAIR;  Surgeon: Edie Norleen PARAS, MD;  Location: [facility] ORS;  Service: Orthopedics;                Laterality: Left; No date: TONSILLECTOMY No date: VASECTOMY     Reproductive/Obstetrics negative OB ROS  Anesthesia Physical Anesthesia Plan  ASA: 3  Anesthesia Plan: General   Post-op Pain Management:    Induction: Intravenous  PONV Risk Score and Plan: Propofol  infusion and TIVA  Airway Management Planned: Natural Airway and Nasal Cannula  Additional Equipment:   Intra-op Plan:   Post-operative Plan:   Informed Consent: I have reviewed the patients History and Physical, chart, labs and discussed the procedure including the risks, benefits and alternatives for the proposed anesthesia with the patient or authorized representative who has indicated his/her understanding and acceptance.     Dental Advisory Given and Interpreter used for interview  Plan Discussed with: Anesthesiologist, CRNA and Surgeon  Anesthesia Plan Comments: (Patient and wife consented for risks of anesthesia including but not limited to:  - adverse reactions to medications - risk of airway placement if required - damage to eyes, teeth, lips or other oral mucosa - nerve damage due to positioning  - sore throat or  hoarseness - Damage to heart, brain, nerves, lungs, other parts of body or loss of life  They voiced understanding and assent.)        Anesthesia Quick Evaluation

## 2024-06-26 NOTE — Op Note (Signed)
 [facility] Patient Name: Alejandro Arias Procedure Date: 06/26/2024 11:24 AM MRN: [id number] Account #: [id number] Date of Birth: [birth year] Admit Type: Outpatient Age: 63 Room: [facility] ENDO ROOM 3 Gender: Male Note Status: Finalized Instrument Name: Upper GI Scope [id number] Procedure:             Upper GI endoscopy Indications:           Dysphagia Providers:             Ole Schick MD, MD Referring MD:          Lavenia Beaver, MD (Referring MD) Medicines:             Monitored Anesthesia Care Complications:         No immediate complications. Estimated blood loss:                         Minimal. Procedure:             Pre-Anesthesia Assessment:                        - Prior to the procedure, a History and Physical was                         performed, and patient medications and allergies were                         reviewed. The patient is competent. The risks and                         benefits of the procedure and the sedation options and                         risks were discussed with the patient. All questions                         were answered and informed consent was obtained.                         Patient identification and proposed procedure were                         verified by the physician, the nurse, the                         anesthesiologist, the anesthetist and the technician                         in the endoscopy suite. Mental Status Examination:                         alert and oriented. Airway Examination: normal                         oropharyngeal airway and neck mobility. Respiratory                         Examination: clear to auscultation. CV Examination:  normal. Prophylactic Antibiotics: The patient does not                         require prophylactic antibiotics. Prior                         Anticoagulants: The patient has taken Plavix                           (clopidogrel ), last dose was 5 days prior to                         procedure. ASA Grade Assessment: III - A patient with                         severe systemic disease. After reviewing the risks and                         benefits, the patient was deemed in satisfactory                         condition to undergo the procedure. The anesthesia                         plan was to use monitored anesthesia care (MAC).                         Immediately prior to administration of medications,                         the patient was re-assessed for adequacy to receive                         sedatives. The heart rate, respiratory rate, oxygen                         saturations, blood pressure, adequacy of pulmonary                         ventilation, and response to care were monitored                         throughout the procedure. The physical status of the                         patient was re-assessed after the procedure.                        After obtaining informed consent, the endoscope was                         passed under direct vision. Throughout the procedure,                         the patient's blood pressure, pulse, and oxygen                         saturations were monitored continuously. The Endoscope  was introduced through the mouth, and advanced to the                         second part of duodenum. The upper GI endoscopy was                         accomplished without difficulty. The patient tolerated                         the procedure well. Findings:      LA Grade C (one or more mucosal breaks continuous between tops of 2 or       more mucosal folds, less than 75% circumference) esophagitis with       bleeding was found. Biopsies were taken with a cold forceps for       histology. Estimated blood loss was minimal.      A small hiatal hernia was present.      The entire examined stomach was normal.      The examined duodenum was  normal. Impression:            - LA Grade C esophagitis with bleeding. Biopsied.                        - Small hiatal hernia.                        - Normal stomach.                        - Normal examined duodenum. Recommendation:        - Discharge patient to home.                        - Resume previous diet.                        - Continue present medications.                        - Resume Plavix  (clopidogrel ) at prior dose today.                        - Use a proton pump inhibitor PO daily.                        - Repeat upper endoscopy in 3 months to check healing.                        - Return to referring physician as previously                         scheduled. Procedure Code(s):     --- Professional ---                        [phone], Esophagogastroduodenoscopy, flexible,                         transoral; with biopsy, single or multiple Diagnosis Code(s):     --- Professional ---  K20.91, Esophagitis, unspecified with bleeding                        K44.9, Diaphragmatic hernia without obstruction or                         gangrene                        R13.10, Dysphagia, unspecified CPT copyright 2022 American Medical Association. All rights reserved. The codes documented in this report are preliminary and upon coder review may  be revised to meet current compliance requirements. Ole Schick MD, MD 06/26/2024 11:57:09 AM Number of Addenda: 0 Note Initiated On: 06/26/2024 11:24 AM Estimated Blood Loss:  Estimated blood loss was minimal.      [facility]

## 2024-06-26 NOTE — Op Note (Signed)
 [facility]astroenterology Patient Name: Alejandro Arias Procedure Date: 06/26/2024 11:23 AM MRN: [id number] Account #: [id number] Date of Birth: 1961/01/28 Admit Type: Outpatient Age: 63 Room: [facility] ENDO ROOM 3 Gender: Male Note Status: Finalized Instrument Name: Colon Scope [id number] Procedure:             Colonoscopy Indications:           Screening in patient at increased risk: Family history                         of 1st-degree relative with colorectal cancer before                         age 63 years Providers:             Ole Schick MD, MD Referring MD:          Lavenia Beaver, MD (Referring MD) Medicines:             Monitored Anesthesia Care Complications:         No immediate complications. Procedure:             Pre-Anesthesia Assessment:                        - Prior to the procedure, a History and Physical was                         performed, and patient medications and allergies were                         reviewed. The patient is competent. The risks and                         benefits of the procedure and the sedation options and                         risks were discussed with the patient. All questions                         were answered and informed consent was obtained.                         Patient identification and proposed procedure were                         verified by the physician, the nurse, the                         anesthesiologist, the anesthetist and the technician                         in the endoscopy suite. Mental Status Examination:                         alert and oriented. Airway Examination: normal                         oropharyngeal airway and neck mobility. Respiratory  Examination: clear to auscultation. CV Examination:                         normal. Prophylactic Antibiotics: The patient does not                         require prophylactic antibiotics. Prior                          Anticoagulants: The patient has taken Plavix                          (clopidogrel ), last dose was 5 days prior to                         procedure. ASA Grade Assessment: III - A patient with                         severe systemic disease. After reviewing the risks and                         benefits, the patient was deemed in satisfactory                         condition to undergo the procedure. The anesthesia                         plan was to use monitored anesthesia care (MAC).                         Immediately prior to administration of medications,                         the patient was re-assessed for adequacy to receive                         sedatives. The heart rate, respiratory rate, oxygen                         saturations, blood pressure, adequacy of pulmonary                         ventilation, and response to care were monitored                         throughout the procedure. The physical status of the                         patient was re-assessed after the procedure.                        After obtaining informed consent, the colonoscope was                         passed under direct vision. Throughout the procedure,                         the patient's blood pressure, pulse, and oxygen  saturations were monitored continuously. The                         Colonoscope was introduced through the anus and                         advanced to the the terminal ileum, with                         identification of the appendiceal orifice and IC                         valve. The colonoscopy was performed without                         difficulty. The patient tolerated the procedure well.                         The quality of the bowel preparation was good. The                         terminal ileum, ileocecal valve, appendiceal orifice,                         and rectum were photographed. Findings:      The perianal and  digital rectal examinations were normal.      The terminal ileum appeared normal.      Multiple large-mouthed and small-mouthed diverticula were found in the       sigmoid colon and descending colon.      Internal hemorrhoids were found during retroflexion. The hemorrhoids       were Grade I (internal hemorrhoids that do not prolapse).      The exam was otherwise without abnormality on direct and retroflexion       views. Impression:            - The examined portion of the ileum was normal.                        - Diverticulosis in the sigmoid colon and in the                         descending colon.                        - Internal hemorrhoids.                        - The examination was otherwise normal on direct and                         retroflexion views.                        - No specimens collected. Recommendation:        - Discharge patient to home.                        - Resume previous diet.                        -  Continue present medications.                        - Repeat colonoscopy in 5 years for screening purposes.                        - Resume Plavix  (clopidogrel ) at prior dose today. Procedure Code(s):     --- Professional ---                        H9894, Colorectal cancer screening; colonoscopy on                         individual at high risk Diagnosis Code(s):     --- Professional ---                        Z80.0, Family history of malignant neoplasm of                         digestive organs                        K64.0, First degree hemorrhoids                        K57.30, Diverticulosis of large intestine without                         perforation or abscess without bleeding CPT copyright 2022 American Medical Association. All rights reserved. The codes documented in this report are preliminary and upon coder review may  be revised to meet current compliance requirements. Ole Schick MD, MD 06/26/2024 11:59:41 AM Number of Addenda:  0 Note Initiated On: 06/26/2024 11:23 AM Scope Withdrawal Time: 0 hours 8 minutes 2 seconds  Total Procedure Duration: 0 hours 12 minutes 54 seconds  Estimated Blood Loss:  Estimated blood loss: none.      [facility]

## 2024-06-26 NOTE — Anesthesia Postprocedure Evaluation (Signed)
 Anesthesia Post Note  Patient: Alejandro Arias  Procedure(s) Performed: COLONOSCOPY EGD (ESOPHAGOGASTRODUODENOSCOPY)  Patient location during evaluation: Endoscopy Anesthesia Type: General Level of consciousness: awake and alert Pain management: pain level controlled Vital Signs Assessment: post-procedure vital signs reviewed and stable Respiratory status: spontaneous breathing, nonlabored ventilation and respiratory function stable Cardiovascular status: blood pressure returned to baseline and stable Postop Assessment: no apparent nausea or vomiting Anesthetic complications: no   There were no known notable events for this encounter.   Last Vitals:  Vitals:   06/26/24 1213 06/26/24 1223  BP: 114/68 135/76  Pulse: 64 62  Resp: (!) 21 18  Temp:    SpO2: 99% 100%    Last Pain:  Vitals:   06/26/24 1223  TempSrc:   PainSc: 0-No pain                 Fairy POUR Jamille Fisher

## 2024-06-26 NOTE — Transfer of Care (Signed)
 Immediate Anesthesia Transfer of Care Note  Patient: Alejandro Arias  Procedure(s) Performed: COLONOSCOPY EGD (ESOPHAGOGASTRODUODENOSCOPY)  Patient Location: PACU  Anesthesia Type:General  Level of Consciousness: drowsy and patient cooperative  Airway & Oxygen Therapy: Patient Spontanous Breathing and Patient connected to nasal cannula oxygen  Post-op Assessment: Report given to RN and Post -op Vital signs reviewed and stable  Post vital signs: stable  Last Vitals:  Vitals Value Taken Time  BP 120/74 06/26/24 11:53  Temp 36 C 06/26/24 11:53  Pulse 69 06/26/24 11:56  Resp 22 06/26/24 11:56  SpO2 100 % 06/26/24 11:56  Vitals shown include unfiled device data.  Last Pain:  Vitals:   06/26/24 1153  TempSrc: Temporal  PainSc: 0-No pain         Complications: There were no known notable events for this encounter.

## 2024-06-28 LAB — SURGICAL PATHOLOGY

## 2024-10-02 ENCOUNTER — Ambulatory Visit: Admit: 2024-10-02

## 2024-10-02 SURGERY — EGD (ESOPHAGOGASTRODUODENOSCOPY)
Anesthesia: General
